# Patient Record
Sex: Male | Born: 1995 | Race: Black or African American | Hispanic: No | Marital: Single | Smoking: Current every day smoker
Health system: Southern US, Community
[De-identification: ages and names within clinical notes are randomized; demographics above are authoritative.]

## PROBLEM LIST (undated history)

## (undated) DIAGNOSIS — F909 Attention-deficit hyperactivity disorder, unspecified type: Secondary | ICD-10-CM

## (undated) DIAGNOSIS — A64 Unspecified sexually transmitted disease: Secondary | ICD-10-CM

## (undated) DIAGNOSIS — A539 Syphilis, unspecified: Secondary | ICD-10-CM

---
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
## 2016-05-19 ENCOUNTER — Encounter (HOSPITAL_COMMUNITY): Payer: Self-pay | Admitting: Emergency Medicine

## 2016-05-19 ENCOUNTER — Emergency Department (HOSPITAL_COMMUNITY)
Admission: EM | Admit: 2016-05-19 | Discharge: 2016-05-19 | Disposition: A | Discharge status: Home / Self Care | Payer: Medicaid Other | Attending: Emergency Medicine | Admitting: Emergency Medicine

## 2016-05-19 ENCOUNTER — Emergency Department (HOSPITAL_COMMUNITY): Payer: Medicaid Other

## 2016-05-19 DIAGNOSIS — Y999 Unspecified external cause status: Secondary | ICD-10-CM | POA: Insufficient documentation

## 2016-05-19 DIAGNOSIS — S59911A Unspecified injury of right forearm, initial encounter: Secondary | ICD-10-CM | POA: Diagnosis present

## 2016-05-19 DIAGNOSIS — S50811A Abrasion of right forearm, initial encounter: Secondary | ICD-10-CM | POA: Diagnosis not present

## 2016-05-19 DIAGNOSIS — Y92410 Unspecified street and highway as the place of occurrence of the external cause: Secondary | ICD-10-CM | POA: Diagnosis not present

## 2016-05-19 DIAGNOSIS — T07XXXA Unspecified multiple injuries, initial encounter: Secondary | ICD-10-CM

## 2016-05-19 DIAGNOSIS — Y939 Activity, unspecified: Secondary | ICD-10-CM | POA: Insufficient documentation

## 2016-05-19 MED ORDER — SILVER SULFADIAZINE 1 % EX CREA
TOPICAL_CREAM | Freq: Two times a day (BID) | CUTANEOUS | Status: DC
Start: 2016-05-19 — End: 2016-05-20
  Administered 2016-05-19: 23:00:00 via TOPICAL
  Filled 2016-05-19: qty 85

## 2016-05-19 MED ORDER — OXYCODONE-ACETAMINOPHEN 5-325 MG PO TABS
1.0000 | ORAL_TABLET | Freq: Once | ORAL | Status: AC
  Administered 2016-05-19: 1 via ORAL
  Filled 2016-05-19: qty 1

## 2016-05-19 MED ORDER — IBUPROFEN 800 MG PO TABS: 800.0000 mg | ORAL_TABLET | Freq: Three times a day (TID) | ORAL | Status: DC

## 2016-05-19 NOTE — ED Notes (Signed)
Pt called to room, no answer  
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
## 2016-05-19 NOTE — Discharge Instructions (Signed)
Continue your silvadene cream twice daily for the next week or until skin on hand is mostly healed. Keep clean with soap and warm water. Follow-up with your primary care doctor. Return here for new concerns.
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
## 2018-06-23 ENCOUNTER — Emergency Department (HOSPITAL_COMMUNITY)
Admission: EM | Admit: 2018-06-23 | Discharge: 2018-06-23 | Disposition: A | Discharge status: Home / Self Care | Payer: Medicaid Other | Attending: Emergency Medicine | Admitting: Emergency Medicine

## 2018-06-23 ENCOUNTER — Encounter (HOSPITAL_COMMUNITY): Payer: Self-pay | Admitting: Emergency Medicine

## 2018-06-23 ENCOUNTER — Other Ambulatory Visit: Payer: Self-pay

## 2018-06-23 DIAGNOSIS — F909 Attention-deficit hyperactivity disorder, unspecified type: Secondary | ICD-10-CM | POA: Insufficient documentation

## 2018-06-23 DIAGNOSIS — R220 Localized swelling, mass and lump, head: Secondary | ICD-10-CM | POA: Diagnosis present

## 2018-06-23 DIAGNOSIS — Z79899 Other long term (current) drug therapy: Secondary | ICD-10-CM | POA: Diagnosis not present

## 2018-06-23 DIAGNOSIS — L700 Acne vulgaris: Secondary | ICD-10-CM | POA: Diagnosis not present

## 2018-06-23 NOTE — ED Provider Notes (Signed)
[facility] EMERGENCY DEPARTMENT Provider Note   CSN: [id number] Arrival date & time: 06/23/18  4098     History   Chief Complaint Chief Complaint  Patient presents with  . Facial Swelling    HPI Thomas Espinoza is a 22 y.o. male.  22 year old male with no past medical history presents to the ED with facial swelling x1 day.  Patient states he first noticed the swelling on the right side of his face as night.  He describes the swelling as mostly on the right side of his nose, with no radiation.  States he has not taken anything for relieve in symptoms.  He denies being bit by anything, fever, rhinorrhea, sinus pressure, chest pain or shortness of breath.     Past Medical History:  Diagnosis Date  . ADHD (attention deficit hyperactivity disorder)     There are no active problems to display for this patient.   History reviewed. No pertinent surgical history.      Home Medications    Prior to Admission medications   Medication Sig Start Date End Date Taking? Authorizing Provider  ibuprofen (ADVIL,MOTRIN) 800 MG tablet Take 1 tablet (800 mg total) by mouth 3 (three) times daily. 05/19/16   Garlon Hatchet, PA-C    Family History No family history on file.  Social History Social History   Tobacco Use  . Smoking status: Never Smoker  . Smokeless tobacco: Never Used  Substance Use Topics  . Alcohol use: Yes  . Drug use: No     Allergies   Patient has no known allergies.   Review of Systems Review of Systems   Physical Exam Updated Vital Signs BP 106/84 (BP Location: Right Arm)   Pulse (!) 51   Temp 98.3 F (36.8 C) (Oral)   Resp 15   Ht 5\' 9"  (1.753 m)   Wt 61.2 kg (135 lb)   SpO2 98%   BMI 19.94 kg/m   Physical Exam  Constitutional: He is oriented to person, place, and time. He appears well-developed and well-nourished.  HENT:  Head: Normocephalic and atraumatic.    Nose: Sinus tenderness present. No rhinorrhea, nose  lacerations, nasal deformity, septal deviation or nasal septal hematoma. No epistaxis.  No foreign bodies. Right sinus exhibits no maxillary sinus tenderness and no frontal sinus tenderness. Left sinus exhibits no maxillary sinus tenderness and no frontal sinus tenderness.    Mouth/Throat: Oropharynx is clear and moist.  Pinpoint closed comedone with erythema present.  No streaking, swelling around the orbital area, pain with eye movement  Eyes: Pupils are equal, round, and reactive to light. Right conjunctiva is not injected. Left conjunctiva is not injected. No scleral icterus. Right eye exhibits normal extraocular motion. Left eye exhibits normal extraocular motion. Right pupil is reactive. Left pupil is reactive.  Neck: Normal range of motion.  Cardiovascular: Normal heart sounds.  Pulmonary/Chest: Effort normal and breath sounds normal. He has no wheezes. He exhibits no tenderness.  Abdominal: Soft. Bowel sounds are normal. He exhibits no distension. There is no tenderness.  Musculoskeletal: He exhibits no tenderness or deformity.  Neurological: He is alert and oriented to person, place, and time.  Skin: Skin is warm and dry.  Nursing note and vitals reviewed.    ED Treatments / Results  Labs (all labs ordered are listed, but only abnormal results are displayed) Labs Reviewed - No data to display  EKG None  Radiology No results found.  Procedures Procedures (including critical care time)  Medications Ordered in ED Medications - No data to display   Initial Impression / Assessment and Plan / ED Course  I have reviewed the triage vital signs and the nursing notes.  Pertinent labs & imaging results that were available during my care of the patient were reviewed by me and considered in my medical decision making (see chart for details).     Upon examination he exhibits erythema on the right nasal fold.  There is no swelling around the orbital area, no pain with eye  movement.  Patient denies any rhinorrhea, fever or sinus pressure.  Believe at this time patient has acne vulgaris, I have explained to patient he likely has a closed comedone that has not come up to surface.  Patient states he has not tried any therapy at home and does not have any medication.  I have advised patient to place toothpaste on the closed comedone to allow for redness to improve.  I have dictated patient to return if he experience any swelling under his eye, pain with movement of his eye, swelling around his eye.  Return precautions provided.  Final Clinical Impressions(s) / ED Diagnoses   Final diagnoses:  Acne vulgaris    ED Discharge Orders    None       Claude MangesSoto, Jayel Inks, PA-C 06/23/18 1015    Donnetta Hutchingook, Brian, MD 06/25/18 [phone]

## 2018-06-23 NOTE — Discharge Instructions (Signed)
Please apply toothpaste to the area to allow drying.Please return to the ED if swelling spreads under your eyes or near eye region. If you experience any fever, pain with eye movement, or chest pain please return to the ED.

## 2018-06-23 NOTE — ED Triage Notes (Signed)
Pt. Stated, the right side of my face is swollen since yesterday.

## 2018-09-19 ENCOUNTER — Encounter (HOSPITAL_COMMUNITY): Payer: Self-pay | Admitting: Emergency Medicine

## 2018-09-19 ENCOUNTER — Emergency Department (HOSPITAL_COMMUNITY)
Admission: EM | Admit: 2018-09-19 | Discharge: 2018-09-20 | Disposition: A | Discharge status: Home / Self Care | Payer: Medicaid Other | Attending: Emergency Medicine | Admitting: Emergency Medicine

## 2018-09-19 ENCOUNTER — Other Ambulatory Visit: Payer: Self-pay

## 2018-09-19 DIAGNOSIS — Z202 Contact with and (suspected) exposure to infections with a predominantly sexual mode of transmission: Secondary | ICD-10-CM | POA: Insufficient documentation

## 2018-09-19 DIAGNOSIS — N489 Disorder of penis, unspecified: Secondary | ICD-10-CM | POA: Insufficient documentation

## 2018-09-19 DIAGNOSIS — F909 Attention-deficit hyperactivity disorder, unspecified type: Secondary | ICD-10-CM | POA: Insufficient documentation

## 2018-09-19 DIAGNOSIS — Z79899 Other long term (current) drug therapy: Secondary | ICD-10-CM | POA: Insufficient documentation

## 2018-09-19 NOTE — ED Triage Notes (Signed)
Reports seeing 2 "bumps" on his penis today and wants to be checked for std's.  Denies having any symptoms.

## 2018-09-20 NOTE — Discharge Instructions (Signed)
The bumps are likely due to irritation where the hair enters the skin.  Stop shaving to decrease the frequency of these bumps. Otherwise try not to touch or irritate the lesions, they will improve with time. You were tested for gonorrhea and chlamydia today.  If the results are positive, you will receive a phone call.  If they are negative, you will not receive a phone call.  Either way, you may look online on my chart. If results are positive, you will need to follow-up with either your primary care doctor, the health department, or urgent care for treatment. Return to the emergency room with any new, worsening, or concerning symptoms.

## 2018-09-20 NOTE — ED Provider Notes (Signed)
[facility] EMERGENCY DEPARTMENT Provider Note   CSN: [id number] Arrival date & time: 09/19/18  2304     History   Chief Complaint Chief Complaint  Patient presents with  . std check    HPI Thomas Espinoza is a 22 y.o. male presenting for evaluation of bumps on his penis.  Patient states that just prior to arrival, he noticed 2 bumps on the dorsal aspect of his penis.  They do not hurt or itch.  There is nothing draining from them.  He denies history of similar.  He denies penile discharge or pain.  He denies urinary symptoms including dysuria, hematuria, urinary frequency.  He denies fevers, chills, nausea, vomiting, abdominal pain.  He has no medical problems, takes medications daily.  He is sexually active with one male partner.  She has no symptoms.  He has not been tested for STDs in the past. Pt states he shaves around his penis occasionally.   HPI  Past Medical History:  Diagnosis Date  . ADHD (attention deficit hyperactivity disorder)     There are no active problems to display for this patient.   History reviewed. No pertinent surgical history.      Home Medications    Prior to Admission medications   Medication Sig Start Date End Date Taking? Authorizing Provider  ibuprofen (ADVIL,MOTRIN) 800 MG tablet Take 1 tablet (800 mg total) by mouth 3 (three) times daily. 05/19/16   Garlon Hatchet, PA-C    Family History No family history on file.  Social History Social History   Tobacco Use  . Smoking status: Never Smoker  . Smokeless tobacco: Never Used  Substance Use Topics  . Alcohol use: Yes  . Drug use: No     Allergies   Patient has no known allergies.   Review of Systems Review of Systems  Constitutional: Negative for fever.  Genitourinary: Negative for discharge, penile pain, penile swelling, scrotal swelling and testicular pain.       2 bumps on penis.     Physical Exam Updated Vital Signs BP (!) 153/75 (BP Location:  Right Arm)   Pulse (!) 104   Temp 97.7 F (36.5 C) (Oral)   Resp 17   Ht 5\' 9"  (1.753 m)   Wt 61.2 kg   SpO2 100%   BMI 19.94 kg/m   Physical Exam  Constitutional: He is oriented to person, place, and time. He appears well-developed and well-nourished. No distress.  HENT:  Head: Normocephalic and atraumatic.  Eyes: EOM are normal.  Neck: Normal range of motion.  Cardiovascular: Normal rate, regular rhythm and intact distal pulses.  Pulmonary/Chest: Effort normal and breath sounds normal. No respiratory distress. He has no wheezes.  Abdominal: Soft. He exhibits no distension. There is no tenderness.  Genitourinary: Testes normal. Circumcised.  Genitourinary Comments: Chaperone present.  Two bumps noted on the dorsal aspect of the patient's penis, towards the base of the shaft.  Bumps consistent with folliculitis.  No pain or drainage.  No ulcerations.  No penile discharge.  No inguinal lymphadenopathy.  No tenderness of the penis or scrotum.  Musculoskeletal: Normal range of motion.  Lymphadenopathy: No inguinal adenopathy noted on the right or left side.  Neurological: He is alert and oriented to person, place, and time.  Skin: Skin is warm. Capillary refill takes less than 2 seconds. No rash noted.  Psychiatric: He has a normal mood and affect.  Nursing note and vitals reviewed.    ED Treatments /  Results  Labs (all labs ordered are listed, but only abnormal results are displayed) Labs Reviewed  GC/CHLAMYDIA PROBE AMP ([place]) NOT AT [facility]    EKG None  Radiology No results found.  Procedures Procedures (including critical care time)  Medications Ordered in ED Medications - No data to display   Initial Impression / Assessment and Plan / ED Course  I have reviewed the triage vital signs and the nursing notes.  Pertinent labs & imaging results that were available during my care of the patient were reviewed by me and considered in my medical decision making (see  chart for details).     Patient presenting for evaluation of bumps on his penis.  Physical exam reassuring, no penile discharge.  Low suspicion for STD at this time.  However patient is requesting testing.  Bumps consistent with folliculitis.  Doubt syphilis or herpes, although offered testing for syphilis.  Patient declined.  Offered testing for HIV, patient declined.  No urinary symptoms, I do not believe UA is necessary.  Discussed with patient that results will return in several days.  Discussed that if testing is positive, he will need to follow-up for treatment.  At this time, patient appears safe for discharge.  Return precautions given.  Patient states he understands and agrees to plan.  Final Clinical Impressions(s) / ED Diagnoses   Final diagnoses:  Penile lesion  Possible exposure to STD    ED Discharge Orders    None       Alveria Apley, PA-C 09/20/18 0040    Ward, Layla Maw, DO 09/20/18 0134

## 2018-09-20 NOTE — ED Notes (Signed)
Patient seen leaving prior to going over discharge instructions.

## 2018-09-22 LAB — GC/CHLAMYDIA PROBE AMP (~~LOC~~) NOT AT ARMC
Chlamydia: NEGATIVE
NEISSERIA GONORRHEA: NEGATIVE

## 2018-09-26 NOTE — ED Notes (Signed)
09/26/2018.  STD results reviewed with pt. Both are negative.  All questions answered.

## 2018-11-07 ENCOUNTER — Emergency Department (HOSPITAL_COMMUNITY)
Admission: EM | Admit: 2018-11-07 | Discharge: 2018-11-07 | Disposition: A | Discharge status: Home / Self Care | Payer: Medicaid Other | Attending: Emergency Medicine | Admitting: Emergency Medicine

## 2018-11-07 ENCOUNTER — Encounter (HOSPITAL_COMMUNITY): Payer: Self-pay | Admitting: *Deleted

## 2018-11-07 ENCOUNTER — Other Ambulatory Visit: Payer: Self-pay

## 2018-11-07 DIAGNOSIS — N489 Disorder of penis, unspecified: Secondary | ICD-10-CM | POA: Insufficient documentation

## 2018-11-07 DIAGNOSIS — Z7251 High risk heterosexual behavior: Secondary | ICD-10-CM

## 2018-11-07 DIAGNOSIS — Z79899 Other long term (current) drug therapy: Secondary | ICD-10-CM | POA: Insufficient documentation

## 2018-11-07 NOTE — Discharge Instructions (Addendum)
Apply vaseline or neosporin until improved.  Free HIV and STD Testing These locations offer FREE confidential testing for HIV, Chlamydia, Gonorrhea, and Syphilis. Non-Traditional Testing Sites Address Telephone  [facility] [address], [place] [phone] Mondays 5pm - 7pm  [facility] [address] [place] [phone] Wednesdays 2pm-8pm  [facility] [address], [place] [phone] Thursdays 9am-12noon 1pm-4pm  [facility] [address], [place] [phone] Tuesdays Thursdays 9am-12noon 1pm-4pm  [facility] offers free, confidential testing and treatment for HIV, Chlamydia, Gonorrhea, Syphilis, Herpes, Bacterial Vaginosis, Yeast, and Trichomoniasis. Traditional Testing   [facility] [address], [place] [phone]  Monday thru Friday  Call for an appointment  [facility] [address] [place] [phone] Monday thru Friday  Call for anappointment.  If you have any questions about this information please call [phone]. 09/27/2011

## 2018-11-07 NOTE — ED Provider Notes (Signed)
[facility] Provider Note   CSN: [id number] Arrival date & time: 11/07/18  [id number]     History   Chief Complaint Chief Complaint  Patient presents with  . Penile Discharge    HPI Thomas Espinoza Thomas Espinoza is a 22 y.o. male who presents the emergency department for evaluation of a lesion to his penis.  The patient states that 3 days ago he noticed that he had some tenderness at the area of his circumcision.  He denies any itching or tingling.  He denies any blistering.  He developed a crack that opened along the circumcision site.  States that he had STD testing last month however he has had unprotected intercourse.  He has more than one partner.  He denies painless lesion, penile discharge, testicle pain, history of STDs.  He denies pain or burning with urination.  HPI  Past Medical History:  Diagnosis Date  . ADHD (attention deficit hyperactivity disorder)     There are no active problems to display for this patient.   History reviewed. No pertinent surgical history.      Home Medications    Prior to Admission medications   Medication Sig Start Date End Date Taking? Authorizing Provider  ibuprofen (ADVIL,MOTRIN) 800 MG tablet Take 1 tablet (800 mg total) by mouth 3 (three) times daily. 05/19/16   Garlon HatchetSanders, Lisa M, PA-C    Family History No family history on file.  Social History Social History   Tobacco Use  . Smoking status: Never Smoker  . Smokeless tobacco: Never Used  Substance Use Topics  . Alcohol use: Yes  . Drug use: No     Allergies   Patient has no known allergies.   Review of Systems Review of Systems Ten systems reviewed and are negative for acute change, except as noted in the HPI.   Physical Exam Updated Vital Signs BP 116/60 (BP Location: Right Arm)   Pulse (!) 57   Temp 97.8 F (36.6 C) (Oral)   Resp 18   Ht 5' 8.75" (1.746 m)   Wt 61.2 kg   SpO2 98%   BMI 20.08 kg/m   Physical Exam Physical Exam    Nursing note and vitals reviewed. Constitutional: He appears well-developed and well-nourished. No distress.  HENT:  Head: Normocephalic and atraumatic.  Eyes: Conjunctivae normal are normal. No scleral icterus.  Neck: Normal range of motion. Neck supple.  Cardiovascular: Normal rate, regular rhythm and normal heart sounds.   Pulmonary/Chest: Effort normal and breath sounds normal. No respiratory distress.  Abdominal: Soft. There is no tenderness.  Musculoskeletal: He exhibits no edema.  Neurological: He is alert.  GU: Normal male anatomy, penis is circumcised.  Along the corona there is a small linear crack with the appearance of chapped skin.  No swelling, no vesicular lesions, crusting.  There is no discharge from the penile meatus, no erythema, testicles are nontender, normal cremasteric reflex. Skin: Skin is warm and dry. He is not diaphoretic.  Psychiatric: His behavior is normal.     ED Treatments / Results  Labs (all labs ordered are listed, but only abnormal results are displayed) Labs Reviewed  RPR  HIV ANTIBODY (ROUTINE TESTING W REFLEX)  GC/CHLAMYDIA PROBE AMP ([place]    EKG None  Radiology No results found.  Procedures Procedures (including critical care time)  Medications Ordered in ED Medications - No data to display   Initial Impression / Assessment and Plan / ED Course  I  have reviewed the triage vital signs and the nursing notes.  Pertinent labs & imaging results that were available during my care of the patient were reviewed by me and considered in my medical decision making (see chart for details).     Patient offered repeat STD testing.  I do not see any evidence of herpetic infection.  Patient is advised to continue treating the region with other thin film of Vaseline or Neosporin.  We will repeat his testing today.  I counseled the patient on safe sex practices.  He appears appropriate for discharge at this time  Final  Clinical Impressions(s) / ED Diagnoses   Final diagnoses:  Lesion of penis  Unprotected sex    ED Discharge Orders    None       Arthor CaptainHarris, Kaysee Hergert, PA-C 11/07/18 1021    Azalia Bilisampos, Kevin, MD 11/11/18 1515

## 2018-11-07 NOTE — ED Notes (Signed)
Pt verbalized understanding of dc instructions, vss, ambulatory with steady gait and nad

## 2018-11-07 NOTE — ED Triage Notes (Signed)
C/o pain on penis onset Sat. States he has a bump on penis

## 2018-11-08 LAB — RPR: RPR: NONREACTIVE

## 2018-11-08 LAB — HIV ANTIBODY (ROUTINE TESTING W REFLEX): HIV SCREEN 4TH GENERATION: NONREACTIVE

## 2018-11-10 LAB — GC/CHLAMYDIA PROBE AMP (~~LOC~~) NOT AT ARMC
Chlamydia: NEGATIVE
Neisseria Gonorrhea: NEGATIVE

## 2018-11-15 ENCOUNTER — Other Ambulatory Visit: Payer: Self-pay

## 2018-11-15 ENCOUNTER — Emergency Department (HOSPITAL_COMMUNITY)
Admission: EM | Admit: 2018-11-15 | Discharge: 2018-11-15 | Disposition: A | Discharge status: Home / Self Care | Payer: Medicaid Other | Attending: Emergency Medicine | Admitting: Emergency Medicine

## 2018-11-15 DIAGNOSIS — F909 Attention-deficit hyperactivity disorder, unspecified type: Secondary | ICD-10-CM | POA: Insufficient documentation

## 2018-11-15 DIAGNOSIS — B002 Herpesviral gingivostomatitis and pharyngotonsillitis: Secondary | ICD-10-CM

## 2018-11-15 DIAGNOSIS — J069 Acute upper respiratory infection, unspecified: Secondary | ICD-10-CM

## 2018-11-15 DIAGNOSIS — B9789 Other viral agents as the cause of diseases classified elsewhere: Secondary | ICD-10-CM

## 2018-11-15 DIAGNOSIS — B009 Herpesviral infection, unspecified: Secondary | ICD-10-CM | POA: Insufficient documentation

## 2018-11-15 DIAGNOSIS — Z79899 Other long term (current) drug therapy: Secondary | ICD-10-CM | POA: Insufficient documentation

## 2018-11-15 MED ORDER — ACYCLOVIR 400 MG PO TABS: 400.0000 mg | ORAL_TABLET | Freq: Three times a day (TID) | ORAL | 0 refills | Status: AC

## 2018-11-15 MED ORDER — BENZONATATE 100 MG PO CAPS: 100.0000 mg | ORAL_CAPSULE | Freq: Three times a day (TID) | ORAL | 0 refills | Status: DC | PRN

## 2018-11-15 NOTE — Discharge Instructions (Addendum)
Please read instructions below.  °You can take tylenol or ibuprofen as needed for sore throat or fever.  °Drink plenty of water.  °Use saline nasal spray for congestion. °You can take tessalon every 8 hours as needed for cough. °Follow up with your primary care provider as needed.  °Return to the ER for inability to swallow liquids, difficulty breathing, or new or worsening symptoms. ° °

## 2018-11-15 NOTE — Progress Notes (Addendum)
ED CM noted patient to have had 4 ED visits in the past 6 months. CM met with patient in triage to discuss assistance with access to care. Patient reports not having a PCP or health insurance. CM discussed the benefits of a Medical Home with PCP patient verbalized understanding, CM provided patient with resources for [facility] ([facility]) with instruction to contact office on Monday 12/30 after 8:30 am to schedule ED follow up appointment and to establish primary care . Patient verbalizes understanding teach back done and patient was  appreciative of the resources. No further ED CM needs identified 

## 2018-11-15 NOTE — ED Triage Notes (Signed)
Pt endorses cold sx with cough, runny nose, sore throat x 2 days. VSS

## 2018-11-15 NOTE — ED Notes (Signed)
Patient verbalizes understanding of discharge instructions. Opportunity for questioning and answers were provided. Armband removed by staff, pt discharged from ED.  

## 2018-11-15 NOTE — ED Provider Notes (Signed)
[facility] Provider Note   CSN: [id number] Arrival date & time: 11/15/18  [id number]     History   Chief Complaint Chief Complaint  Patient presents with  . URI    HPI Thomas Espinoza is a 22 y.o. male presenting to the ED with complaint of Glenford PeersUri sx that began on Tuesday.  Patient states symptoms began with a dry cough and hot and cold flashes.  He says by Thursday he was feeling fatigued with mild intermittent headaches.  He states he has had sore throat as well as a runny nose.  Denies associated body aches, difficulty breathing or swallowing, ear pain.  Has taken multiple over-the-counter medications for symptoms, including Delsym, NyQuil, vitamin C, TheraFlu, throat lozenges.  Has not had influenza vaccine this year.  No sick contacts. Patient also reports lesion to his left lower lip that he thinks is a cold sore.  States that arose some days ago, however is improving and scabbed.  States this is happened in the past and has applied Abreva but is requesting different medication.  Rash initially began with a few clear fluid-filled bumps which have ruptured and now is scabbed over.  The history is provided by the patient.    Past Medical History:  Diagnosis Date  . ADHD (attention deficit hyperactivity disorder)     There are no active problems to display for this patient.   No past surgical history on file.      Home Medications    Prior to Admission medications   Medication Sig Start Date End Date Taking? Authorizing Provider  acyclovir (ZOVIRAX) 400 MG tablet Take 1 tablet (400 mg total) by mouth 3 (three) times daily for 7 days. 11/15/18 11/22/18  Robinson, [place] N, PA-C  benzonatate (TESSALON) 100 MG capsule Take 1 capsule (100 mg total) by mouth 3 (three) times daily as needed for cough. 11/15/18   Robinson, [place] N, PA-C  ibuprofen (ADVIL,MOTRIN) 800 MG tablet Take 1 tablet (800 mg total) by mouth 3 (three) times daily. 05/19/16    Garlon HatchetSanders, Lisa M, PA-C    Family History No family history on file.  Social History Social History   Tobacco Use  . Smoking status: Never Smoker  . Smokeless tobacco: Never Used  Substance Use Topics  . Alcohol use: Yes  . Drug use: No     Allergies   Patient has no known allergies.   Review of Systems Review of Systems  Constitutional: Positive for fatigue.  HENT: Positive for rhinorrhea and sore throat. Negative for congestion, ear pain, trouble swallowing and voice change.   Respiratory: Positive for cough. Negative for shortness of breath.   Skin: Positive for rash.     Physical Exam Updated Vital Signs BP 112/68 (BP Location: Right Arm)   Pulse (!) 58   Temp 98.7 F (37.1 C) (Oral)   Resp 16   Ht 5\' 9"  (1.753 m)   Wt 61.2 kg   SpO2 100%   BMI 19.94 kg/m   Physical Exam Vitals signs and nursing note reviewed.  Constitutional:      Appearance: He is well-developed. He is not ill-appearing.  HENT:     Head: Normocephalic and atraumatic.     Left Ear: Tympanic membrane and ear canal normal.     Ears:     Comments: Unable to visualize right TM 2/t cerumen Eyes:     Conjunctiva/sclera: Conjunctivae normal.  Neck:     Musculoskeletal: Normal range of motion  and neck supple.  Cardiovascular:     Rate and Rhythm: Normal rate and regular rhythm.  Pulmonary:     Effort: Pulmonary effort is normal.     Breath sounds: Normal breath sounds.  Lymphadenopathy:     Cervical: No cervical adenopathy.  Skin:    Comments: Left lower lip with scabbed papular lesion, mildly tender.   Neurological:     Mental Status: He is alert.  Psychiatric:        Mood and Affect: Mood normal.        Behavior: Behavior normal.      ED Treatments / Results  Labs (all labs ordered are listed, but only abnormal results are displayed) Labs Reviewed - No data to display  EKG None  Radiology No results found.  Procedures Procedures (including critical care  time)  Medications Ordered in ED Medications - No data to display   Initial Impression / Assessment and Plan / ED Course  I have reviewed the triage vital signs and the nursing notes.  Pertinent labs & imaging results that were available during my care of the patient were reviewed by me and considered in my medical decision making (see chart for details).     Patients symptoms are consistent with URI, likely viral etiology, consider influenza.  Patient is outside of the treatment window for Tamiflu.  Afebrile, tolerating secretions.  Lungs clear to auscultation bilaterally. Also with history and lesion to left lip consistent with herpes simplex virus (this is not primary outbreak).  Patient counseled on transmission of herpes virus.  Also discussed that antibiotics are not indicated for viral respiratory infections. Pt will be discharged with symptomatic treatment and Rx for acyclovir.  Verbalizes understanding and is agreeable with plan. Pt is hemodynamically stable & in NAD prior to dc.  Discussed results, findings, treatment and follow up. Patient advised of return precautions. Patient verbalized understanding and agreed with plan.  Final Clinical Impressions(s) / ED Diagnoses   Final diagnoses:  Viral URI with cough  Oral herpes simplex infection    ED Discharge Orders         Ordered    benzonatate (TESSALON) 100 MG capsule  3 times daily PRN     11/15/18 1126    acyclovir (ZOVIRAX) 400 MG tablet  3 times daily     11/15/18 1141           Robinson, [place] N, [place] JerseyPA-C 11/15/18 1147    Maia PlanLong, Joshua G, MD 11/15/18 2033

## 2018-11-24 ENCOUNTER — Encounter (HOSPITAL_COMMUNITY): Payer: Self-pay | Admitting: Emergency Medicine

## 2018-11-24 ENCOUNTER — Emergency Department (HOSPITAL_COMMUNITY)
Admission: EM | Admit: 2018-11-24 | Discharge: 2018-11-24 | Disposition: A | Discharge status: Home / Self Care | Payer: Medicaid Other | Attending: Emergency Medicine | Admitting: Emergency Medicine

## 2018-11-24 DIAGNOSIS — H11422 Conjunctival edema, left eye: Secondary | ICD-10-CM | POA: Insufficient documentation

## 2018-11-24 DIAGNOSIS — H5712 Ocular pain, left eye: Secondary | ICD-10-CM | POA: Insufficient documentation

## 2018-11-24 DIAGNOSIS — H5789 Other specified disorders of eye and adnexa: Secondary | ICD-10-CM

## 2018-11-24 DIAGNOSIS — Z79899 Other long term (current) drug therapy: Secondary | ICD-10-CM | POA: Insufficient documentation

## 2018-11-24 MED ORDER — CLINDAMYCIN HCL 300 MG PO CAPS: 300.0000 mg | ORAL_CAPSULE | Freq: Three times a day (TID) | ORAL | 0 refills | Status: AC

## 2018-11-24 MED ORDER — IBUPROFEN 800 MG PO TABS
800.0000 mg | ORAL_TABLET | Freq: Once | ORAL | Status: AC
  Administered 2018-11-24: 800 mg via ORAL
  Filled 2018-11-24: qty 1

## 2018-11-24 MED ORDER — FLUORESCEIN SODIUM 1 MG OP STRP
1.0000 | ORAL_STRIP | Freq: Once | OPHTHALMIC | Status: AC
  Administered 2018-11-24: 1 via OPHTHALMIC
  Filled 2018-11-24: qty 1

## 2018-11-24 MED ORDER — TETRACAINE HCL 0.5 % OP SOLN
2.0000 [drp] | Freq: Once | OPHTHALMIC | Status: AC
  Administered 2018-11-24: 2 [drp] via OPHTHALMIC
  Filled 2018-11-24: qty 4

## 2018-11-24 NOTE — ED Provider Notes (Signed)
[facility] EMERGENCY DEPARTMENT Provider Note   CSN: [id number] Arrival date & time: 11/24/18  0557     History   Chief Complaint Chief Complaint  Patient presents with  . Eye Pain    Left    HPI Thomas Espinoza is a 23 y.o. male senting for evaluation of left eye pain and swelling.  Patient states that when he woke up this morning, his left eye was swollen shut.  He reports a lot of pain when trying to open his eye.  He applied warm compress, this improved with the swelling, but he still has pain when he blinks.  Pain is mostly of the superior and lateral aspect of the left eye.  He denies symptoms on the right side.  He denies photophobia, eye redness, drainage/crusting/discharge, or pain on the anterior surface of the eye.  He denies injury to the eye.  He denies foreign body sensation.  He does not wear contacts.  He denies history of eye problems in the past.  He has not taken anything for pain including Tylenol or ibuprofen.  Patient reports symptoms have improved since he first woke up this morning, but are still present.  He denies symptoms yesterday or last night.  He recently is recovering from a  Cold, otherwise denies sick contacts or contacts with eye sxs.   HPI  Past Medical History:  Diagnosis Date  . ADHD (attention deficit hyperactivity disorder)     There are no active problems to display for this patient.   History reviewed. No pertinent surgical history.      Home Medications    Prior to Admission medications   Medication Sig Start Date End Date Taking? Authorizing Provider  benzonatate (TESSALON) 100 MG capsule Take 1 capsule (100 mg total) by mouth 3 (three) times daily as needed for cough. 11/15/18   Robinson, [place] N, PA-C  clindamycin (CLEOCIN) 300 MG capsule Take 1 capsule (300 mg total) by mouth 3 (three) times daily for 10 days. 11/24/18 12/04/18  Kyngston Pickelsimer, PA-C  ibuprofen (ADVIL,MOTRIN) 800 MG tablet Take 1 tablet (800  mg total) by mouth 3 (three) times daily. 05/19/16   Garlon Hatchet, PA-C    Family History No family history on file.  Social History Social History   Tobacco Use  . Smoking status: Never Smoker  . Smokeless tobacco: Never Used  Substance Use Topics  . Alcohol use: Yes  . Drug use: No     Allergies   Patient has no known allergies.   Review of Systems Review of Systems  Constitutional: Negative for fever.  HENT: Negative for congestion and ear pain.   Eyes: Positive for pain. Negative for photophobia, discharge, redness, itching and visual disturbance.  Respiratory: Negative for cough.      Physical Exam Updated Vital Signs BP 117/74   Pulse (!) 55   Temp 97.6 F (36.4 C) (Oral)   Resp 12   SpO2 100%   Physical Exam Vitals signs and nursing note reviewed.  Constitutional:      General: He is not in acute distress.    Appearance: He is well-developed.     Comments: Sitting comfortably in the bed in no acute distress  HENT:     Head: Normocephalic and atraumatic.      Ears:     Comments: TMs nonerythematous and nonbulging bilaterally.    Nose:     Comments: No nasal congestion or mucosal edema.    Mouth/Throat:  Comments: OP clear without tonsillar swelling or exudate. Eyes:     General:        Right eye: No discharge.        Left eye: No discharge.     Extraocular Movements: Extraocular movements intact.     Conjunctiva/sclera: Conjunctivae normal.     Pupils: Pupils are equal, round, and reactive to light.     Comments: Small knot at the lateral L eye. Mild swelling of the lateral upper lid. No drainage. EOMI and PERRLA. No conjunctival injections. Visual acuity reassuring. Fluorescein stain negative.   Neck:     Musculoskeletal: Normal range of motion.  Pulmonary:     Effort: Pulmonary effort is normal.  Abdominal:     General: There is no distension.  Musculoskeletal: Normal range of motion.  Skin:    General: Skin is warm.     Findings: No  rash.  Neurological:     Mental Status: He is alert and oriented to person, place, and time.      ED Treatments / Results  Labs (all labs ordered are listed, but only abnormal results are displayed) Labs Reviewed - No data to display  EKG None  Radiology No results found.  Procedures Procedures (including critical care time)  Medications Ordered in ED Medications  tetracaine (PONTOCAINE) 0.5 % ophthalmic solution 2 drop (2 drops Left Eye Given 11/24/18 [id number])  fluorescein ophthalmic strip 1 strip (1 strip Left Eye Given 11/24/18 0639)  ibuprofen (ADVIL,MOTRIN) tablet 800 mg (800 mg Oral Given 11/24/18 [id number])     Initial Impression / Assessment and Plan / ED Course  I have reviewed the triage vital signs and the nursing notes.  Pertinent labs & imaging results that were available during my care of the patient were reviewed by me and considered in my medical decision making (see chart for details).     Presenting for evaluation of left eye pain and swelling.  Physical exam reassuring, no visual deficit, EOMI and PERRLA.  No abrasion or ulceration.  Patient with abdominal of the lateral left thigh and swelling of the left upper lid.  Concern for possible early abscess/infection.  No pain with extraocular motions, doubt orbital cellulitis.  No pain of the eye itself, doubt glaucoma.  No trauma or injury, photophobia, redness, doubt iritis.  As such, will treat with antibiotics and NSAIDs.  Discussed continued use of warm compresses.  Patient to follow-up with ophthalmology as needed.  At this time, patient appears safe for discharge.  Return precautions given.  Patient states he understands and agrees to plan.   Final Clinical Impressions(s) / ED Diagnoses   Final diagnoses:  Left eye pain  Eye swelling, left    ED Discharge Orders         Ordered    clindamycin (CLEOCIN) 300 MG capsule  3 times daily     11/24/18 0700           Alveria ApleyCaccavale, Ilayda Toda, PA-C 11/24/18 [id number]      Dione BoozeGlick, David, MD 11/24/18 [phone]

## 2018-11-24 NOTE — ED Triage Notes (Signed)
Patient reports left eye pain " every time I blink" onset this morning , denies injury or blurred vision.

## 2018-11-24 NOTE — Discharge Instructions (Addendum)
Take antibiotics as prescribed.  Take the entire course, even if your symptoms improve. Take ibuprofen 3 times a day with meals for the next week. Take 800 mg (4 pills) at a time.  Do not take other anti-inflammatories at the same time (Advil, Motrin, naproxen, Aleve). You may supplement with Tylenol if you need further pain control. Use warm compresses 3 times a day to help with pain and swelling. Follow-up with the eye doctor listed below if your symptoms not improving after 24 to 48 hours of antibiotics. Return to the emergency room if you develop vision loss, severe worsening symptoms, or any new, or concerning symptoms.

## 2019-04-07 ENCOUNTER — Encounter (HOSPITAL_COMMUNITY): Payer: Self-pay

## 2019-04-07 ENCOUNTER — Other Ambulatory Visit: Payer: Self-pay

## 2019-04-07 ENCOUNTER — Emergency Department (HOSPITAL_COMMUNITY)
Admission: EM | Admit: 2019-04-07 | Discharge: 2019-04-07 | Disposition: A | Discharge status: Home / Self Care | Payer: BLUE CROSS/BLUE SHIELD | Attending: Emergency Medicine | Admitting: Emergency Medicine

## 2019-04-07 DIAGNOSIS — N489 Disorder of penis, unspecified: Secondary | ICD-10-CM | POA: Diagnosis present

## 2019-04-07 DIAGNOSIS — F909 Attention-deficit hyperactivity disorder, unspecified type: Secondary | ICD-10-CM | POA: Diagnosis not present

## 2019-04-07 DIAGNOSIS — B009 Herpesviral infection, unspecified: Secondary | ICD-10-CM

## 2019-04-07 DIAGNOSIS — Z202 Contact with and (suspected) exposure to infections with a predominantly sexual mode of transmission: Secondary | ICD-10-CM | POA: Diagnosis not present

## 2019-04-07 MED ORDER — VALACYCLOVIR HCL 1 G PO TABS: 1000.0000 mg | ORAL_TABLET | Freq: Two times a day (BID) | ORAL | 0 refills | Status: DC

## 2019-04-07 NOTE — ED Triage Notes (Signed)
Pt arrives POV for eval for STD. Pt reports he woke up this morning and states that he noted a "reddened area" above his penis, w/ "2 dots on his penis". Denies known contact w/ positive partner. Denies hx of same. Denies penile discharge, dysuria, etc.

## 2019-04-07 NOTE — Discharge Instructions (Signed)
I suspect that you have herpes simplex virus and I will treat you with Valtrex today.  You can follow-up with the health department for subsequent STD testing and treatment of subsequent outbreaks, as HSV is unfortunately recurrent disease without a cure.  Please read the information attached.  Please return emergency department if you develop any new or worsening symptoms.  You will be called in 3 days if you test positive for gonorrhea, chlamydia, HIV, or syphilis.  If positive, please go to the health department for treatment and abstain from sexual intercourse for a week after you and your sexual partners have been treated.

## 2019-04-07 NOTE — ED Provider Notes (Signed)
[facility] EMERGENCY DEPARTMENT Provider Note   CSN: [id number] Arrival date & time: 04/07/19  1420    History   Chief Complaint Chief Complaint  Patient presents with  . Exposure to STD    HPI Thomas Espinoza is a 23 y.o. male with history of ADHD who presents with a 1 day history of lesion to the base of his penis.  He reports the lesion itches and he can tell it is there, but denies any pain.  He has had several new sexual partners recently.  He denies any penile discharge, dysuria, scrotal pain or swelling.  He does not have any known STD exposure, however would like to be tested.  He denies any abdominal pain or fever.  He has had cold sores before, but never had any lesions on his genitals.     HPI  Past Medical History:  Diagnosis Date  . ADHD (attention deficit hyperactivity disorder)     There are no active problems to display for this patient.   History reviewed. No pertinent surgical history.      Home Medications    Prior to Admission medications   Medication Sig Start Date End Date Taking? Authorizing Provider  benzonatate (TESSALON) 100 MG capsule Take 1 capsule (100 mg total) by mouth 3 (three) times daily as needed for cough. 11/15/18   Robinson, [place] N, PA-C  ibuprofen (ADVIL,MOTRIN) 800 MG tablet Take 1 tablet (800 mg total) by mouth 3 (three) times daily. 05/19/16   Garlon Hatchet, PA-C  valACYclovir (VALTREX) 1000 MG tablet Take 1 tablet (1,000 mg total) by mouth 2 (two) times daily. 04/07/19   Emi Holes, PA-C    Family History History reviewed. No pertinent family history.  Social History Social History   Tobacco Use  . Smoking status: Never Smoker  . Smokeless tobacco: Never Used  Substance Use Topics  . Alcohol use: Yes  . Drug use: No     Allergies   Patient has no known allergies.   Review of Systems Review of Systems  Constitutional: Negative for fever.  Genitourinary: Negative for discharge,  frequency, penile pain, penile swelling, scrotal swelling and testicular pain.     Physical Exam Updated Vital Signs BP 114/88 (BP Location: Right Arm)   Pulse 60   Temp 97.7 F (36.5 C) (Oral)   Resp 16   Ht 5\' 9"  (1.753 m)   Wt 59 kg   SpO2 100%   BMI 19.20 kg/m   Physical Exam Vitals signs and nursing note reviewed. Exam conducted with a chaperone present.  Constitutional:      General: He is not in acute distress.    Appearance: He is well-developed. He is not diaphoretic.  HENT:     Head: Normocephalic and atraumatic.     Mouth/Throat:     Pharynx: No oropharyngeal exudate.  Eyes:     General: No scleral icterus.       Right eye: No discharge.        Left eye: No discharge.     Conjunctiva/sclera: Conjunctivae normal.     Pupils: Pupils are equal, round, and reactive to light.  Neck:     Musculoskeletal: Normal range of motion and neck supple.     Thyroid: No thyromegaly.  Cardiovascular:     Rate and Rhythm: Normal rate and regular rhythm.     Heart sounds: Normal heart sounds. No murmur. No friction rub. No gallop.   Pulmonary:  Effort: Pulmonary effort is normal. No respiratory distress.     Breath sounds: Normal breath sounds. No stridor. No wheezing or rales.  Abdominal:     General: Bowel sounds are normal. There is no distension.     Palpations: Abdomen is soft.     Tenderness: There is no abdominal tenderness. There is no guarding or rebound.  Genitourinary:    Penis: Circumcised. Lesions present.      Scrotum/Testes: Normal.        Right: Tenderness or swelling not present.        Left: Tenderness or swelling not present.     Comments: Excoriated area at the base of the penis with 2 vesicles proximally Lymphadenopathy:     Cervical: No cervical adenopathy.     Lower Body: No right inguinal adenopathy. No left inguinal adenopathy.  Skin:    General: Skin is warm and dry.     Coloration: Skin is not pale.     Findings: No rash.  Neurological:      Mental Status: He is alert.     Coordination: Coordination normal.      ED Treatments / Results  Labs (all labs ordered are listed, but only abnormal results are displayed) Labs Reviewed  RPR  HIV ANTIBODY (ROUTINE TESTING W REFLEX)  GC/CHLAMYDIA PROBE AMP ([place]) NOT AT [facility]    EKG None  Radiology No results found.  Procedures Procedures (including critical care time)  Medications Ordered in ED Medications - No data to display   Initial Impression / Assessment and Plan / ED Course  I have reviewed the triage vital signs and the nursing notes.  Pertinent labs & imaging results that were available during my care of the patient were reviewed by me and considered in my medical decision making (see chart for details).        Patient with suspected herpes simplex virus.  Will treat with Valtrex.  GC/chlamydia, HIV, RPR pending.  Patient counseled on herpes simplex virus and advised to follow-up with the health department for further outbreaks and future testing.  Return precautions discussed.  Patient understands and agrees with plan.  Patient vital stable throughout ED course and discharged in satisfactory condition.  Final Clinical Impressions(s) / ED Diagnoses   Final diagnoses:  STD exposure  Herpes simplex infection    ED Discharge Orders         Ordered    valACYclovir (VALTREX) 1000 MG tablet  2 times daily     04/07/19 [address]           Remee Charley M, PA-C 04/07/19 1711    Maia PlanLong, Joshua G, MD 04/08/19 [phone]

## 2019-04-08 LAB — RPR, QUANT+TP ABS (REFLEX)
Rapid Plasma Reagin, Quant: 1:1 {titer} — ABNORMAL HIGH
T Pallidum Abs: NONREACTIVE

## 2019-04-08 LAB — RPR: RPR Ser Ql: REACTIVE — AB

## 2019-04-08 LAB — HIV ANTIBODY (ROUTINE TESTING W REFLEX): HIV Screen 4th Generation wRfx: NONREACTIVE

## 2019-04-09 LAB — GC/CHLAMYDIA PROBE AMP (~~LOC~~) NOT AT ARMC
Chlamydia: POSITIVE — AB
Neisseria Gonorrhea: NEGATIVE

## 2019-04-10 ENCOUNTER — Other Ambulatory Visit: Payer: Self-pay

## 2019-04-10 ENCOUNTER — Encounter (HOSPITAL_COMMUNITY): Payer: Self-pay | Admitting: Emergency Medicine

## 2019-04-10 ENCOUNTER — Emergency Department (HOSPITAL_COMMUNITY)
Admission: EM | Admit: 2019-04-10 | Discharge: 2019-04-10 | Disposition: A | Discharge status: Home / Self Care | Payer: BLUE CROSS/BLUE SHIELD | Attending: Emergency Medicine | Admitting: Emergency Medicine

## 2019-04-10 DIAGNOSIS — A510 Primary genital syphilis: Secondary | ICD-10-CM | POA: Insufficient documentation

## 2019-04-10 DIAGNOSIS — F909 Attention-deficit hyperactivity disorder, unspecified type: Secondary | ICD-10-CM | POA: Insufficient documentation

## 2019-04-10 DIAGNOSIS — Z7251 High risk heterosexual behavior: Secondary | ICD-10-CM | POA: Diagnosis not present

## 2019-04-10 DIAGNOSIS — R21 Rash and other nonspecific skin eruption: Secondary | ICD-10-CM | POA: Diagnosis present

## 2019-04-10 DIAGNOSIS — A749 Chlamydial infection, unspecified: Secondary | ICD-10-CM | POA: Insufficient documentation

## 2019-04-10 MED ORDER — AZITHROMYCIN 250 MG PO TABS
1000.0000 mg | ORAL_TABLET | Freq: Once | ORAL | Status: AC
  Administered 2019-04-10: 1000 mg via ORAL
  Filled 2019-04-10: qty 4

## 2019-04-10 MED ORDER — PENICILLIN G BENZATHINE 1200000 UNIT/2ML IM SUSP
2.4000 10*6.[IU] | Freq: Once | INTRAMUSCULAR | Status: AC
  Administered 2019-04-10: 12:00:00 2.4 10*6.[IU] via INTRAMUSCULAR
  Filled 2019-04-10: qty 4

## 2019-04-10 NOTE — Discharge Planning (Signed)
EDCM following for disposition needs.  

## 2019-04-10 NOTE — ED Notes (Signed)
Patient verbalizes understanding of discharge instructions. Opportunity for questioning and answers were provided. Armband removed by staff, pt discharged from ED home via POV.  

## 2019-04-10 NOTE — Discharge Instructions (Signed)
You have been treated today for syphilis and chlamydia.  You need to inform all of your sexual partners of your positive results so they can be tested and treated as well.  It is very important that you use protection when engaging in sexual intercourse to prevent tracking these easily preventable STDs.  It is recommended that you go to the health department in 6 months as well as in 12 months to have recheck for syphilis to ensure your treatment worked.  You can report to the health department for any further concerns regarding herpes virus.

## 2019-04-10 NOTE — ED Provider Notes (Signed)
[facility] EMERGENCY DEPARTMENT Provider Note   CSN: [id number] Arrival date & time: 04/10/19  1129    History   Chief Complaint Chief Complaint  Patient presents with  . SEXUALLY TRANSMITTED DISEASE    HPI LEART DUKE is a 23 y.o. male presenting to the emergency department after positive STD testing that was done 3 days ago in the ED on 04/07/2019.  Patient presented with rash to his penis and was diagnosed with genital herpes, discharged with acyclovir.  He had STD panel done which revealed a reactive RPR and quantitative RPR 1-1, T palladium antibodies are nonreactive.  HIV is negative.  Positive for chlamydia, negative for gonorrhea.  Reports no changes in symptoms, the rash on his genitalia is still relatively asymptomatic.  Denies fevers, chills, malaise, or any systemic symptoms.  No full body rashes.  He is sexually active with male partners without protection.     The history is provided by the patient.    Past Medical History:  Diagnosis Date  . ADHD (attention deficit hyperactivity disorder)     There are no active problems to display for this patient.   History reviewed. No pertinent surgical history.      Home Medications    Prior to Admission medications   Medication Sig Start Date End Date Taking? Authorizing Provider  benzonatate (TESSALON) 100 MG capsule Take 1 capsule (100 mg total) by mouth 3 (three) times daily as needed for cough. 11/15/18   Alyvia Derk, [place] N, PA-C  ibuprofen (ADVIL,MOTRIN) 800 MG tablet Take 1 tablet (800 mg total) by mouth 3 (three) times daily. 05/19/16   Garlon Hatchet, PA-C  valACYclovir (VALTREX) 1000 MG tablet Take 1 tablet (1,000 mg total) by mouth 2 (two) times daily. 04/07/19   Emi Holes, PA-C    Family History No family history on file.  Social History Social History   Tobacco Use  . Smoking status: Never Smoker  . Smokeless tobacco: Never Used  Substance Use Topics  . Alcohol use:  Yes  . Drug use: No     Allergies   Patient has no known allergies.   Review of Systems Review of Systems  All other systems reviewed and are negative.    Physical Exam Updated Vital Signs BP 129/82 (BP Location: Right Arm)   Pulse 62   Temp 98.2 F (36.8 C) (Oral)   Resp 16   Ht 5\' 9"  (1.753 m)   Wt 59 kg   SpO2 100%   BMI 19.20 kg/m   Physical Exam Vitals signs and nursing note reviewed.  Constitutional:      General: He is not in acute distress.    Appearance: He is well-developed.  HENT:     Head: Normocephalic and atraumatic.  Eyes:     Conjunctiva/sclera: Conjunctivae normal.  Cardiovascular:     Rate and Rhythm: Normal rate.  Pulmonary:     Effort: Pulmonary effort is normal.  Abdominal:     Palpations: Abdomen is soft.  Genitourinary:    Comments: Exam performed with male RN chaperone present.  There is scabbed lesion to the base of the penis.  No tenderness.  No drainage.  No vesicles.  No redness. Skin:    General: Skin is warm.  Neurological:     Mental Status: He is alert.  Psychiatric:        Behavior: Behavior normal.      ED Treatments / Results  Labs (all labs ordered are listed,  but only abnormal results are displayed) Labs Reviewed - No data to display  EKG None  Radiology No results found.  Procedures Procedures (including critical care time)  Medications Ordered in ED Medications  azithromycin (ZITHROMAX) tablet 1,000 mg (1,000 mg Oral Given 04/10/19 1214)  penicillin g benzathine (BICILLIN LA) 1200000 UNIT/2ML injection 2.4 Million Units (2.4 Million Units Intramuscular Given 04/10/19 1214)     Initial Impression / Assessment and Plan / ED Course  I have reviewed the triage vital signs and the nursing notes.  Pertinent labs & imaging results that were available during my care of the patient were reviewed by me and considered in my medical decision making (see chart for details).        Patient presenting for  treatment of syphilis and Chlamydia after recent testing in the ED on 04/07/2019.  Patient appears to have primary syphilis outbreak.  No systemic symptoms or diffuse rash to suggest secondary syphilis.  Negative T palladium antibodies on serology.  Patient treated for chlamydia with azithromycin and for syphilis with penicillin IM.  Patient requesting HSV serology, however will provide information for contacting the health department for this.  Instructed patient to be retested in 6 and 12 months per up-to-date guidelines.  Provided counseling for risky sexual will behavior.  Safe for discharge.  Discussed results, findings, treatment and follow up. Patient advised of return precautions. Patient verbalized understanding and agreed with plan.   Final Clinical Impressions(s) / ED Diagnoses   Final diagnoses:  Primary syphilis  Chlamydia  High risk heterosexual behavior    ED Discharge Orders    None       Amarilis Belflower, [place] N, PA-C 04/10/19 1259    Mancel Bale, MD 04/11/19 [phone]

## 2019-04-10 NOTE — ED Triage Notes (Signed)
Pt seen on 04/07/2019 and diagnosed with STD's. Pt reports taking medication (valtrex) that was prescribed for him and his symptoms are getting better but he is requesting a shot due to a call about his RPR being positive. Pt has no additional symptoms.

## 2019-09-05 ENCOUNTER — Other Ambulatory Visit: Payer: Self-pay

## 2019-09-05 ENCOUNTER — Ambulatory Visit (HOSPITAL_COMMUNITY)
Admission: EM | Admit: 2019-09-05 | Discharge: 2019-09-05 | Disposition: A | Discharge status: Home / Self Care | Payer: BLUE CROSS/BLUE SHIELD | Attending: Family Medicine | Admitting: Family Medicine

## 2019-09-05 ENCOUNTER — Encounter (HOSPITAL_COMMUNITY): Payer: Self-pay | Admitting: *Deleted

## 2019-09-05 DIAGNOSIS — N485 Ulcer of penis: Secondary | ICD-10-CM | POA: Insufficient documentation

## 2019-09-05 HISTORY — DX: Unspecified sexually transmitted disease: A64

## 2019-09-05 HISTORY — DX: Syphilis, unspecified: A53.9

## 2019-09-05 MED ORDER — ACYCLOVIR 800 MG PO TABS: 800.0000 mg | ORAL_TABLET | Freq: Three times a day (TID) | ORAL | 1 refills | Status: AC

## 2019-09-05 MED ORDER — VALACYCLOVIR HCL 1 G PO TABS: 1000.0000 mg | ORAL_TABLET | Freq: Two times a day (BID) | ORAL | 0 refills | Status: AC

## 2019-09-05 NOTE — ED Triage Notes (Signed)
C/O 2 lesions to underside of penis x 3-4 days with some discomfort.  Denies penile discharge.

## 2019-09-05 NOTE — ED Provider Notes (Signed)
[place]    CSN: [id number] Arrival date & time: 09/05/19  1251      History   Chief Complaint Chief Complaint  Patient presents with  . Appointment    1310  . Penis Pain    HPI Thomas Espinoza is a 23 y.o. male.   HPI C/O 2 lesions to underside of penis x 3-4 days with some discomfort.  Denies penile discharge.   Patient has had a genital ulcer in the past and was evaluated with blood and swab testing which was negative.  Patient intercourse with his girlfriend about 3 weeks ago and felt tingling in the penis.  He started having an ulceration about 3 to 4 days ago at the base of the penis.  He states that he had oral as well as vaginal intercourse.  He does not use a condom. Past Medical History:  Diagnosis Date  . ADHD (attention deficit hyperactivity disorder)   . STD (male)   . Syphilis     There are no active problems to display for this patient.   History reviewed. No pertinent surgical history.     Home Medications    Prior to Admission medications   Medication Sig Start Date End Date Taking? Authorizing Provider  Acetaminophen (TYLENOL PO) Take by mouth.   Yes [provider]  ASPIRIN PO Take by mouth.   Yes [provider]  benzonatate (TESSALON) 100 MG capsule Take 1 capsule (100 mg total) by mouth 3 (three) times daily as needed for cough. 11/15/18   Robinson, [place] N, PA-C  ibuprofen (ADVIL,MOTRIN) 800 MG tablet Take 1 tablet (800 mg total) by mouth 3 (three) times daily. 05/19/16   Larene Pickett, PA-C  valACYclovir (VALTREX) 1000 MG tablet Take 1 tablet (1,000 mg total) by mouth 2 (two) times daily. 04/07/19   Frederica Kuster, PA-C    Family History Family History  Problem Relation Age of Onset  . Diabetes Mother   . Healthy Father     Social History Social History   Tobacco Use  . Smoking status: Never Smoker  . Smokeless tobacco: Never Used  Substance Use Topics  . Alcohol use: Yes    Comment: every  other week  . Drug use: Never     Allergies   Patient has no known allergies.   Review of Systems Review of Systems  Skin: Positive for rash.  All other systems reviewed and are negative.    Physical Exam Triage Vital Signs ED Triage Vitals  Enc Vitals Group     BP 09/05/19 1324 128/79     Pulse Rate 09/05/19 1324 (!) 59     Resp 09/05/19 1324 16     Temp 09/05/19 1324 98.5 F (36.9 C)     Temp Source 09/05/19 1324 Other     SpO2 09/05/19 1324 98 %     Weight --      Height --      Head Circumference --      Peak Flow --      Pain Score 09/05/19 1326 7     Pain Loc --      Pain Edu? --      Excl. in [place]? --    No data found.  Updated Vital Signs BP 128/79   Pulse (!) 59   Temp 98.5 F (36.9 C) (Other (Comment))   Resp 16   SpO2 98%    Physical Exam Vitals signs and nursing note reviewed.  Constitutional:      Appearance: Normal appearance.  Neck:     Musculoskeletal: Normal range of motion and neck supple.  Pulmonary:     Effort: Pulmonary effort is normal.  Genitourinary:    Comments: 1 cm ulceration at the base of the penis on the ventral side Musculoskeletal: Normal range of motion.  Skin:    General: Skin is warm.  Neurological:     General: No focal deficit present.     Mental Status: He is alert and oriented to person, place, and time.      UC Treatments / Results  Labs (all labs ordered are listed, but only abnormal results are displayed) Labs Reviewed - No data to display  EKG   Radiology No results found.  Procedures Procedures (including critical care time)  Medications Ordered in UC Medications - No data to display  Initial Impression / Assessment and Plan / UC Course  I have reviewed the triage vital signs and the nursing notes.  Pertinent labs & imaging results that were available during my care of the patient were reviewed by me and considered in my medical decision making (see chart for details).    Final Clinical  Impressions(s) / UC Diagnoses   Final diagnoses:  None   Discharge Instructions   None    ED Prescriptions    None     PDMP not reviewed this encounter.   Elvina Sidle, MD 09/05/19 1350

## 2019-09-07 LAB — HSV CULTURE AND TYPING

## 2019-09-08 ENCOUNTER — Encounter (HOSPITAL_COMMUNITY): Payer: Self-pay

## 2019-09-08 ENCOUNTER — Telehealth (HOSPITAL_COMMUNITY): Payer: Self-pay | Admitting: Emergency Medicine

## 2019-09-08 NOTE — Telephone Encounter (Signed)
Number on file called me back, I answered and stated who I was, a male on the phone yelled 'I already know about it!" and hung up the phone.

## 2019-11-04 ENCOUNTER — Other Ambulatory Visit: Payer: Self-pay

## 2019-11-04 ENCOUNTER — Encounter (HOSPITAL_COMMUNITY): Payer: Self-pay

## 2019-11-04 ENCOUNTER — Ambulatory Visit (HOSPITAL_COMMUNITY)
Admission: EM | Admit: 2019-11-04 | Discharge: 2019-11-04 | Disposition: A | Discharge status: Home / Self Care | Payer: Self-pay | Attending: Family Medicine | Admitting: Family Medicine

## 2019-11-04 DIAGNOSIS — L700 Acne vulgaris: Secondary | ICD-10-CM

## 2019-11-04 MED ORDER — DOXYCYCLINE HYCLATE 100 MG PO CAPS: 100.0000 mg | ORAL_CAPSULE | Freq: Two times a day (BID) | ORAL | 0 refills | Status: DC

## 2019-11-04 MED ORDER — CLINDAMYCIN PHOSPHATE 1 % EX SOLN: Freq: Two times a day (BID) | CUTANEOUS | 1 refills | Status: DC

## 2019-11-04 NOTE — ED Triage Notes (Signed)
Pt presents with on bump on his left cheek on his face X 1 week.

## 2019-11-04 NOTE — ED Provider Notes (Signed)
[facility] CARE CENTER   [id number] 11/04/19 Arrival Time: 1003  ASSESSMENT & PLAN:  1. Open comedone     No signs of abscess requiring I&D.  Begin: Meds ordered this encounter  Medications  . doxycycline (VIBRAMYCIN) 100 MG capsule    Sig: Take 1 capsule (100 mg total) by mouth 2 (two) times daily.    Dispense:  14 capsule    Refill:  0  . clindamycin (CLEOCIN T) 1 % external solution    Sig: Apply topically 2 (two) times daily.    Dispense:  30 mL    Refill:  1    Will follow up with PCP or here if worsening or failing to improve as anticipated. Reviewed expectations re: course of current medical issues. Questions answered. Outlined signs and symptoms indicating need for more acute intervention. Patient verbalized understanding. After Visit Summary given.   SUBJECTIVE:  Thomas Espinoza is a 23 y.o. male who presents with a skin complaint.   Location: L upper cheek Onset: gradual Duration: at least one week Associated pruritis? none Associated pain? moderate Progression: increasing steadily  Drainage? none reported Known trigger? No  New soaps/lotions/topicals/detergents? No  Environmental exposures? No  Contacts with similar? No  Recent travel? No  Other associated symptoms: none Therapies tried thus far: none Arthralgia or myalgia? none Recent illness? none Fever? none New medications? none No specific aggravating or alleviating factors reported. H/O similar. "Usually see a doctor and the give me a medicine to take and then it goes away."  ROS: As per HPI. All other systems negative.   OBJECTIVE: Vitals:   11/04/19 1025  BP: 112/73  Pulse: (!) 58  Resp: 16  Temp: 98.4 F (36.9 C)  TempSrc: Oral  SpO2: 100%    General appearance: alert; no distress HEENT: [place]; AT Neck: supple with FROM; no LAD Lungs: clear to auscultation bilaterally Heart: regular rate and rhythm Extremities: no edema; moves all extremities normally Skin: warm and dry;  approx 4-17mm open comedone on left upper cheek; scattered closed comedones on face; no active drainage or bleeding Psychological: alert and cooperative; normal mood and affect  No Known Allergies  Past Medical History:  Diagnosis Date  . ADHD (attention deficit hyperactivity disorder)   . STD (male)   . Syphilis    Social History   Socioeconomic History  . Marital status: Single    Spouse name: Not on file  . Number of children: Not on file  . Years of education: Not on file  . Highest education level: Not on file  Occupational History  . Not on file  Tobacco Use  . Smoking status: Never Smoker  . Smokeless tobacco: Never Used  Substance and Sexual Activity  . Alcohol use: Yes    Comment: every other week  . Drug use: Never  . Sexual activity: Yes    Birth control/protection: None  Other Topics Concern  . Not on file  Social History Narrative  . Not on file   Social Determinants of Health   Financial Resource Strain:   . Difficulty of Paying Living Expenses: Not on file  Food Insecurity:   . Worried About Programme researcher, broadcasting/film/video in the Last Year: Not on file  . Ran Out of Food in the Last Year: Not on file  Transportation Needs:   . Lack of Transportation (Medical): Not on file  . Lack of Transportation (Non-Medical): Not on file  Physical Activity:   . Days of Exercise per Week: Not  on file  . Minutes of Exercise per Session: Not on file  Stress:   . Feeling of Stress : Not on file  Social Connections:   . Frequency of Communication with Friends and Family: Not on file  . Frequency of Social Gatherings with Friends and Family: Not on file  . Attends Religious Services: Not on file  . Active Member of Clubs or Organizations: Not on file  . Attends Archivist Meetings: Not on file  . Marital Status: Not on file  Intimate Partner Violence:   . Fear of Current or Ex-Partner: Not on file  . Emotionally Abused: Not on file  . Physically Abused: Not on file   . Sexually Abused: Not on file   Family History  Problem Relation Age of Onset  . Diabetes Mother   . Healthy Father    History reviewed. No pertinent surgical history.   Vanessa Kick, MD 11/04/19 1047

## 2021-01-04 ENCOUNTER — Emergency Department (HOSPITAL_COMMUNITY)
Admission: EM | Admit: 2021-01-04 | Discharge: 2021-01-04 | Disposition: A | Discharge status: Home / Self Care | Payer: BC Managed Care – PPO | Attending: Emergency Medicine | Admitting: Emergency Medicine

## 2021-01-04 ENCOUNTER — Encounter (HOSPITAL_COMMUNITY): Payer: Self-pay | Admitting: Emergency Medicine

## 2021-01-04 DIAGNOSIS — X58XXXA Exposure to other specified factors, initial encounter: Secondary | ICD-10-CM | POA: Diagnosis not present

## 2021-01-04 DIAGNOSIS — S00211A Abrasion of right eyelid and periocular area, initial encounter: Secondary | ICD-10-CM | POA: Diagnosis not present

## 2021-01-04 DIAGNOSIS — Z23 Encounter for immunization: Secondary | ICD-10-CM | POA: Insufficient documentation

## 2021-01-04 DIAGNOSIS — S0591XA Unspecified injury of right eye and orbit, initial encounter: Secondary | ICD-10-CM | POA: Diagnosis present

## 2021-01-04 MED ORDER — TETANUS-DIPHTH-ACELL PERTUSSIS 5-2.5-18.5 LF-MCG/0.5 IM SUSY
0.5000 mL | PREFILLED_SYRINGE | Freq: Once | INTRAMUSCULAR | Status: AC
  Administered 2021-01-04: 0.5 mL via INTRAMUSCULAR
  Filled 2021-01-04: qty 0.5

## 2021-01-04 MED ORDER — CEPHALEXIN 250 MG PO CAPS: 250.0000 mg | ORAL_CAPSULE | Freq: Four times a day (QID) | ORAL | 0 refills | Status: AC

## 2021-01-04 NOTE — Discharge Instructions (Signed)
At this time there does not appear to be the presence of an emergent medical condition, however there is always the potential for conditions to change. Please read and follow the below instructions.  Please return to the Emergency Department immediately for any new or worsening symptoms. Please be sure to follow up with your Primary Care Provider within one week regarding your visit today; please call their office to schedule an appointment even if you are feeling better for a follow-up visit. Please take your antibiotic Keflex as prescribed until complete to help with your symptoms.  Please drink enough water to avoid dehydration and get plenty of rest.  Go to the nearest Emergency Department immediately if: You have fever or chills You have pain when you move your eye. Your face: Hurts. Is red. Is swollen. You have loss of vision. You have drainage from your eye You have any new/concerning or worsening symptoms   Please read the additional information packets attached to your discharge summary.  Do not take your medicine if  develop an itchy rash, swelling in your mouth or lips, or difficulty breathing; call 911 and seek immediate emergency medical attention if this occurs.  You may review your lab tests and imaging results in their entirety on your MyChart account.  Please discuss all results of fully with your primary care provider and other specialist at your follow-up visit.  Note: Portions of this text may have been transcribed using voice recognition software. Every effort was made to ensure accuracy; however, inadvertent computerized transcription errors may still be present.

## 2021-01-04 NOTE — ED Triage Notes (Signed)
Pt woke up with swelling of right eye lid and pain, no redness in eye or eye drainage.

## 2021-01-04 NOTE — ED Provider Notes (Addendum)
[facility] EMERGENCY DEPARTMENT Provider Note   CSN: [id number] Arrival date & time: 01/04/21  0800     History Chief Complaint  Patient presents with  . Eye Pain    Thomas Espinoza is a 25 y.o. male history of HSV, treated syphilis, ADHD. Patient presents today for pain and swelling of the right eyebrow onset this morning describes mild aching pain of the area constant worsened with palpation improved with rest nonradiating. Denies similar in the past. Cannot recall any injury to the area. Denies headache, vision change, pain with eye movement, eye drainage, photophobia or any additional concerns.  HPI     Past Medical History:  Diagnosis Date  . ADHD (attention deficit hyperactivity disorder)   . STD (male)   . Syphilis     There are no problems to display for this patient.   History reviewed. No pertinent surgical history.     Family History  Problem Relation Age of Onset  . Diabetes Mother   . Healthy Father     Social History   Tobacco Use  . Smoking status: Never Smoker  . Smokeless tobacco: Never Used  Vaping Use  . Vaping Use: Never used  Substance Use Topics  . Alcohol use: Yes    Comment: every other week  . Drug use: Never    Home Medications Prior to Admission medications   Medication Sig Start Date End Date Taking? Authorizing Provider  cephALEXin (KEFLEX) 250 MG capsule Take 1 capsule (250 mg total) by mouth 4 (four) times daily for 7 days. 01/04/21 01/11/21 Yes Harlene Salts A, PA-C  acyclovir (ZOVIRAX) 800 MG tablet Take 1 tablet (800 mg total) by mouth 3 (three) times daily. 09/05/19   Elvina Sidle, MD  valACYclovir (VALTREX) 1000 MG tablet Take 1 tablet (1,000 mg total) by mouth 2 (two) times daily. 09/05/19   Elvina Sidle, MD    Allergies    Patient has no known allergies.  Review of Systems   Review of Systems  Constitutional: Negative.  Negative for chills and fever.  HENT: Positive for facial  swelling. Negative for trouble swallowing.   Eyes: Negative for photophobia, pain, redness and visual disturbance.  Neurological: Negative.  Negative for headaches.    Physical Exam Updated Vital Signs BP 122/84   Pulse 68   Temp (!) 97.4 F (36.3 C) (Oral)   Resp 18   SpO2 96%   Physical Exam Constitutional:      General: He is not in acute distress.    Appearance: Normal appearance. He is well-developed. He is not ill-appearing or diaphoretic.  HENT:     Head: Normocephalic and atraumatic.     Jaw: There is normal jaw occlusion. No trismus.      Comments: Mild swelling along lateral right eyebrow extending just below but not involving the eyelid itself. There is a small abrasion to the lateral portion.    Right Ear: External ear normal.     Left Ear: External ear normal.  Eyes:     General: Vision grossly intact. Gaze aligned appropriately.     Extraocular Movements: Extraocular movements intact.     Conjunctiva/sclera: Conjunctivae normal.     Pupils: Pupils are equal, round, and reactive to light.   Neck:     Trachea: Trachea and phonation normal.  Pulmonary:     Effort: Pulmonary effort is normal. No respiratory distress.  Abdominal:     General: There is no distension.     Palpations:  Abdomen is soft.     Tenderness: There is no abdominal tenderness. There is no guarding or rebound.  Musculoskeletal:        General: Normal range of motion.     Cervical back: Normal range of motion.  Skin:    General: Skin is warm and dry.  Neurological:     Mental Status: He is alert.     GCS: GCS eye subscore is 4. GCS verbal subscore is 5. GCS motor subscore is 6.     Comments: Speech is clear and goal oriented, follows commands Major Cranial nerves without deficit, no facial droop Moves extremities without ataxia, coordination intact  Psychiatric:        Behavior: Behavior normal.       ED Results / Procedures / Treatments   Labs (all labs ordered are listed, but  only abnormal results are displayed) Labs Reviewed - No data to display  EKG None  Radiology No results found.  Procedures Procedures   Medications Ordered in ED Medications  Tdap (BOOSTRIX) injection 0.5 mL (0.5 mLs Intramuscular Given 01/04/21 9147)    ED Course  I have reviewed the triage vital signs and the nursing notes.  Pertinent labs & imaging results that were available during my care of the patient were reviewed by me and considered in my medical decision making (see chart for details).    MDM Rules/Calculators/A&P                         Additional history obtained from: 1. Nursing notes from this visit. ------------------- 25 year old male presented with some swelling of the right lateral eyelid that began this morning. No vision changes. He has a small abrasion or possible ingrown hair to the lateral aspect of the swelling it does not include the eyelid itself but the space just above the eyelid between the eyebrow. There is no significant erythema increased warmth. He has no ocular involvement, no vision change or eye drainage or conjunctivitis.  Will update patient's Tdap today and treat with course of Keflex for possible mild soft tissue infection.  Does not appear as herpes zoster ophthalmicus, keratitis, scleritis, uveitis, conjunctivitis, orbital cellulitis, trauma or other emergent ocular pathology.  I encouraged patient to see his primary care provider this week for recheck  At this time there does not appear to be any evidence of an acute emergency medical condition and the patient appears stable for discharge with appropriate outpatient follow up. Diagnosis was discussed with patient who verbalizes understanding of care plan and is agreeable to discharge. I have discussed return precautions with patient who verbalizes understanding. Patient encouraged to follow-up with their PCP. All questions answered.  Patient's case discussed with Dr. Lockie Mola who agrees with  plan to discharge with follow-up.   Note: Portions of this report may have been transcribed using voice recognition software. Every effort was made to ensure accuracy; however, inadvertent computerized transcription errors may still be present. Final Clinical Impression(s) / ED Diagnoses Final diagnoses:  Abrasion of right eyebrow, initial encounter    Rx / DC Orders ED Discharge Orders         Ordered    cephALEXin (KEFLEX) 250 MG capsule  4 times daily        01/04/21 0846           Bill Salinas, PA-C 01/04/21 0851    Bill Salinas, PA-C 01/04/21 0856    Virgina Norfolk, DO 01/04/21 1038

## 2022-04-07 ENCOUNTER — Emergency Department (HOSPITAL_COMMUNITY): Payer: Medicaid Other

## 2022-04-07 ENCOUNTER — Emergency Department (HOSPITAL_COMMUNITY)
Admission: EM | Admit: 2022-04-07 | Discharge: 2022-04-08 | Disposition: A | Discharge status: Home / Self Care | Payer: Medicaid Other | Attending: Emergency Medicine | Admitting: Emergency Medicine

## 2022-04-07 DIAGNOSIS — Z5321 Procedure and treatment not carried out due to patient leaving prior to being seen by health care provider: Secondary | ICD-10-CM | POA: Insufficient documentation

## 2022-04-07 DIAGNOSIS — N509 Disorder of male genital organs, unspecified: Secondary | ICD-10-CM | POA: Insufficient documentation

## 2022-04-07 LAB — URINALYSIS, ROUTINE W REFLEX MICROSCOPIC
Bacteria, UA: NONE SEEN
Bilirubin Urine: NEGATIVE
Glucose, UA: NEGATIVE mg/dL
Hgb urine dipstick: NEGATIVE
Ketones, ur: NEGATIVE mg/dL
Leukocytes,Ua: NEGATIVE
Nitrite: NEGATIVE
Protein, ur: 100 mg/dL — AB
Specific Gravity, Urine: 1.026 (ref 1.005–1.030)
pH: 6 (ref 5.0–8.0)

## 2022-04-07 NOTE — ED Triage Notes (Signed)
Pt c/o "lump" on R side of scrotum, first noticed Friday, "started feeling it" today. Denies pain, denies urinary symptoms

## 2022-04-08 ENCOUNTER — Ambulatory Visit: Payer: Self-pay

## 2022-04-08 ENCOUNTER — Emergency Department (HOSPITAL_COMMUNITY)
Admission: EM | Admit: 2022-04-08 | Discharge: 2022-04-08 | Disposition: A | Discharge status: Home / Self Care | Payer: Medicaid Other | Attending: Emergency Medicine | Admitting: Emergency Medicine

## 2022-04-08 ENCOUNTER — Other Ambulatory Visit: Payer: Self-pay

## 2022-04-08 ENCOUNTER — Encounter (HOSPITAL_COMMUNITY): Payer: Self-pay | Admitting: Emergency Medicine

## 2022-04-08 DIAGNOSIS — N433 Hydrocele, unspecified: Secondary | ICD-10-CM

## 2022-04-08 DIAGNOSIS — I861 Scrotal varices: Secondary | ICD-10-CM

## 2022-04-08 NOTE — Discharge Instructions (Signed)
Return for any problem.  Practice good scrotal support.  Abstain from sexual intercourse for the next 1 week.  As instructed, follow-up with [facility].

## 2022-04-08 NOTE — ED Provider Notes (Signed)
Patient left without being seen by a provider.    Thomas Espinoza, Corene Cornea, MD 04/08/22 [phone]

## 2022-04-08 NOTE — ED Triage Notes (Signed)
Pt states, "you can just look at the notes from my visit from yesterday."  Per Megan's triage note yesterday, "Pt c/o "lump" on R side of scrotum, first noticed Friday, "started feeling it" today. Denies pain, denies urinary symptoms"

## 2022-04-08 NOTE — ED Notes (Signed)
Pt left before being seen. Pt was wandering around

## 2022-04-08 NOTE — ED Provider Notes (Signed)
[facility] EMERGENCY DEPARTMENT Provider Note   CSN: [id number] Arrival date & time: 04/08/22  1013     History  No chief complaint on file.   Thomas Espinoza is a 26 y.o. male.  26 year old male with prior medical history as detailed below presents for evaluation.  Patient reports that he felt a lump on the right side of his scrotum.  He first noticed this on Friday.  Patient was seen in triage yesterday.  He did not stay for the entirety of his work-up.  UA obtained yesterday was without significant abnormality.  Ultrasound obtained yesterday demonstrated presence of hydrocele on left and bilateral varicoceles.  Patient reports to this provider that he has been "fucking a lot" for the last week.  He noticed the lump after a significantly vigorous round of intercourse.  He denies penile drainage or discomfort.  He denies urinary symptoms.  He is not concerned about the possibility of STD.  He declines STD testing.  He denies fever.  He denies significant increase in size of the "lump" on the right side of his scrotum.  The history is provided by the patient and medical records.  Illness Location:  " Lump" on right scrotum Severity:  Mild Onset quality:  Gradual Duration:  2 days Timing:  Rare Progression:  Waxing and waning Chronicity:  New     Home Medications Prior to Admission medications   Medication Sig Start Date End Date Taking? Authorizing Provider  acyclovir (ZOVIRAX) 800 MG tablet Take 1 tablet (800 mg total) by mouth 3 (three) times daily. 09/05/19   Elvina Sidle, MD  valACYclovir (VALTREX) 1000 MG tablet Take 1 tablet (1,000 mg total) by mouth 2 (two) times daily. 09/05/19   Elvina Sidle, MD      Allergies    Patient has no known allergies.    Review of Systems   Review of Systems  All other systems reviewed and are negative.  Physical Exam Updated Vital Signs BP 125/80 (BP Location: Right Arm)   Pulse (!) 53   Temp 97.9 F  (36.6 C) (Oral)   Resp 16   SpO2 99%  Physical Exam Vitals and nursing note reviewed.  Constitutional:      General: He is not in acute distress.    Appearance: Normal appearance. He is well-developed.  HENT:     Head: Normocephalic and atraumatic.  Eyes:     Conjunctiva/sclera: Conjunctivae normal.     Pupils: Pupils are equal, round, and reactive to light.  Cardiovascular:     Rate and Rhythm: Normal rate and regular rhythm.     Heart sounds: Normal heart sounds.  Pulmonary:     Effort: Pulmonary effort is normal. No respiratory distress.     Breath sounds: Normal breath sounds.  Abdominal:     General: There is no distension.     Palpations: Abdomen is soft.     Tenderness: There is no abdominal tenderness.  Genitourinary:    Comments: Minimally palpable varicocele on the right side of the scrotum.  No significant abnormality noted to the bilateral testicles.  No significant tenderness noted in the scrotum.  No overlying erythema.  No penile discharge or drainage.  No ulcerations or lesions on the penis or other genitals. Musculoskeletal:        General: No deformity. Normal range of motion.     Cervical back: Normal range of motion and neck supple.  Skin:    General: Skin is warm and dry.  Neurological:     General: No focal deficit present.     Mental Status: He is alert and oriented to person, place, and time.    ED Results / Procedures / Treatments   Labs (all labs ordered are listed, but only abnormal results are displayed) Labs Reviewed - No data to display  EKG None  Radiology US SCROTUM W/DOPPLER  Result Date: 04/08/2022 CLINICAL DATA:  Testicular pain. Concern for mass. Lump over the lateral aspect of the right scrotum. EXAM: SCROTAL ULTRASOUND DOPPLER ULTRASOUND OF THE TESTICLES TECHNIQUE: Complete ultrasound examination of the testicles, epididymis, and other scrotal structures was performed. Color and spectral Doppler ultrasound were also utilized to  evaluate blood flow to the testicles. COMPARISON:  None Available. FINDINGS: Right testicle Measurements: 4.2 x 2.3 x 3.3 cm. No mass or microlithiasis visualized. Left testicle Measurements: 4.6 x 2.1 x 3.5 cm. No mass or microlithiasis visualized. Right epididymis:  Normal in size and appearance. Left epididymis:  Normal in size and appearance. Hydrocele:  Small minimally complex left hydrocele. Varicocele:  Bilateral varicoceles. Pulsed Doppler interrogation of both testes demonstrates normal low resistance arterial and venous waveforms bilaterally. No discrete mass noted in the area of palpable concern. Varicose veins are present in the scrotum adjacent to the palpable concern. IMPRESSION: 1. Unremarkable testicles. 2. Small minimally complex left hydrocele. 3. Bilateral varicoceles. 4. No discrete lesion or the palpable concern. Electronically Signed   By: Elgie Collard M.D.   On: 04/08/2022 00:23    Procedures Procedures    Medications Ordered in ED Medications - No data to display  ED Course/ Medical Decision Making/ A&P                           Medical Decision Making   Medical Screen Complete  This patient presented to the ED with complaint of lump on scrotum.  This complaint involves an extensive number of treatment options. The initial differential diagnosis includes, but is not limited to, varicocele, hydrocele, etc.  This presentation is: Acute, Self-Limited, Previously Undiagnosed, and Uncertain Prognosis  Patient presents to complete evaluation that was initiated last night.  Patient was palpable varicocele on right.  This was confirmed with ultrasound obtained overnight.  UA obtained overnight was also without significant abnormality.  Patient is reassured by ultrasound findings.  Patient understands need for scrotal support, abstinence from intercourse for the next week, close follow-up with urology.  Strict return precautions given and understood.  Reports  close follow-up stressed.  Of note, patient is not interested in STD screening.  Patient is advised that the health department and/or urology could assist in STD screening if he desires.  Additional history obtained:  External records from outside sources obtained and reviewed including prior ED visits and prior Inpatient records.  Problem List / ED Course:  Varicocele, hydrocele  Disposition:  After consideration of the diagnostic results and the patients response to treatment, I feel that the patent would benefit from close outpatient follow-up.          Final Clinical Impression(s) / ED Diagnoses Final diagnoses:  Varicocele  Hydrocele, unspecified hydrocele type    Rx / DC Orders ED Discharge Orders     None         Wynetta Fines, MD 04/08/22 1126

## 2022-04-08 NOTE — ED Notes (Signed)
Called pt for room multiple times, no response 

## 2022-10-19 DIAGNOSIS — Z419 Encounter for procedure for purposes other than remedying health state, unspecified: Secondary | ICD-10-CM | POA: Diagnosis not present

## 2022-11-04 ENCOUNTER — Other Ambulatory Visit: Payer: Self-pay

## 2022-11-04 ENCOUNTER — Emergency Department (HOSPITAL_COMMUNITY): Payer: Commercial Managed Care - HMO

## 2022-11-04 ENCOUNTER — Emergency Department (HOSPITAL_COMMUNITY)
Admission: EM | Admit: 2022-11-04 | Discharge: 2022-11-04 | Disposition: A | Discharge status: Home / Self Care | Payer: Commercial Managed Care - HMO | Attending: Emergency Medicine | Admitting: Emergency Medicine

## 2022-11-04 DIAGNOSIS — Z1152 Encounter for screening for COVID-19: Secondary | ICD-10-CM | POA: Diagnosis not present

## 2022-11-04 DIAGNOSIS — R509 Fever, unspecified: Secondary | ICD-10-CM | POA: Diagnosis present

## 2022-11-04 DIAGNOSIS — J101 Influenza due to other identified influenza virus with other respiratory manifestations: Secondary | ICD-10-CM | POA: Diagnosis not present

## 2022-11-04 LAB — RESP PANEL BY RT-PCR (RSV, FLU A&B, COVID)  RVPGX2
Influenza A by PCR: POSITIVE — AB
Influenza B by PCR: NEGATIVE
Resp Syncytial Virus by PCR: NEGATIVE
SARS Coronavirus 2 by RT PCR: NEGATIVE

## 2022-11-04 MED ORDER — ACETAMINOPHEN 325 MG PO TABS
650.0000 mg | ORAL_TABLET | Freq: Once | ORAL | Status: AC
  Administered 2022-11-04: 650 mg via ORAL
  Filled 2022-11-04: qty 2

## 2022-11-04 MED ORDER — OSELTAMIVIR PHOSPHATE 75 MG PO CAPS: 75.0000 mg | ORAL_CAPSULE | Freq: Two times a day (BID) | ORAL | 0 refills | Status: AC

## 2022-11-04 NOTE — Discharge Instructions (Signed)
As we discussed, you are flu a positive from your swab yesterday.  Your x-ray today did not show any pneumonia  Please take Tamiflu as prescribed.  As we also discussed, you are expected to run fevers for 5 to 7 days so please take Tylenol or Motrin for fever  See your doctor for follow-up  Return to ER if you have worse cough or fever or trouble breathing

## 2022-11-04 NOTE — ED Provider Notes (Signed)
[name]-EMERGENCY DEPT Provider Note   CSN: [id number] Arrival date & time: 11/04/22  1257     History  Chief Complaint  Patient presents with   Fever   Cough   Headache   Sore Throat    Thomas Espinoza is a 26 y.o. male otherwise healthy here presenting with cough and fever.  Patient has started running-fever for the last 2 days.  Patient went to urgent care yesterday.  He was told that everything was negative but several of the PCR's did not come back.  I reviewed the record from yesterday and it turns out that he is flu a positive.  He is complaining of persistent productive yellowish-greenish cough and fever.  The history is provided by the patient.       Home Medications Prior to Admission medications   Medication Sig Start Date End Date Taking? Authorizing Provider  acyclovir (ZOVIRAX) 800 MG tablet Take 1 tablet (800 mg total) by mouth 3 (three) times daily. 09/05/19   Elvina Sidle, MD  valACYclovir (VALTREX) 1000 MG tablet Take 1 tablet (1,000 mg total) by mouth 2 (two) times daily. 09/05/19   Elvina Sidle, MD      Allergies    Patient has no known allergies.    Review of Systems   Review of Systems  Constitutional:  Positive for fever.  Respiratory:  Positive for cough.   Neurological:  Positive for headaches.  All other systems reviewed and are negative.   Physical Exam Updated Vital Signs BP 117/76 (BP Location: Left Arm)   Pulse 91   Temp (!) 101.4 F (38.6 C) (Oral)   Resp 17   Ht 5\' 9"  (1.753 m)   Wt 65.8 kg   SpO2 94%   BMI 21.41 kg/m  Physical Exam Vitals and nursing note reviewed.  Constitutional:      Appearance: He is well-developed.  HENT:     Head: Normocephalic.  Eyes:     Extraocular Movements: Extraocular movements intact.     Pupils: Pupils are equal, round, and reactive to light.  Cardiovascular:     Rate and Rhythm: Normal rate and regular rhythm.  Pulmonary:     Effort: Pulmonary effort is  normal.     Comments: Diminished bilaterally  Abdominal:     General: Bowel sounds are normal.     Palpations: Abdomen is soft.  Musculoskeletal:     Cervical back: Normal range of motion and neck supple.  Neurological:     Mental Status: He is alert and oriented to person, place, and time.  Psychiatric:        Mood and Affect: Mood normal.        Behavior: Behavior normal.     ED Results / Procedures / Treatments   Labs (all labs ordered are listed, but only abnormal results are displayed) Labs Reviewed  RESP PANEL BY RT-PCR (RSV, FLU A&B, COVID)  [id number]    EKG None  Radiology DG Chest 2 View  Result Date: 11/04/2022 CLINICAL DATA:  Cough EXAM: CHEST - 2 VIEW COMPARISON:  08/02/2012 FINDINGS: The heart size and mediastinal contours are within normal limits. Both lungs are clear. The visualized skeletal structures are unremarkable. IMPRESSION: No active cardiopulmonary disease. Electronically Signed   By: 08/04/2012 D.O.   On: 11/04/2022 15:25    Procedures Procedures    Medications Ordered in ED Medications  acetaminophen (TYLENOL) tablet 650 mg (650 mg Oral Given 11/04/22 1524)    ED Course/  Medical Decision Making/ A&P                           Medical Decision Making Thomas Espinoza is a 26 y.o. male here presenting with cough and fever.  Patient's flu a positive yesterday.  Patient's chest x-ray is clear.  I think symptoms are from flu.  Patient wants Tamiflu.    Problems Addressed: Influenza A: acute illness or injury  Amount and/or Complexity of Data Reviewed Radiology: ordered and independent interpretation performed. Decision-making details documented in ED Course.  Risk OTC drugs.    Final Clinical Impression(s) / ED Diagnoses Final diagnoses:  None    Rx / DC Orders ED Discharge Orders     None         Charlynne Pander, MD 11/04/22 1545

## 2022-11-04 NOTE — ED Provider Triage Note (Signed)
Emergency Medicine Provider Triage Evaluation Note  Thomas Espinoza , a 26 y.o. male  was evaluated in triage.  Pt complains of sore throat, cough, fever, chills x 2 days. Went to UC yesterday, had negative rapid strep, flu and covid testing. Throat culture and covid PCR pending. Was given motrin. Diagnosed with likely viral illness. Took some ibuprofen through the night but fever would come back up right after. No known medical problems. Pt is concerned about pneumonia  Review of Systems  Positive: As above Negative: N/v/d  Physical Exam  BP 117/76 (BP Location: Left Arm)   Pulse 91   Temp (!) 101.4 F (38.6 C) (Oral)   Resp 17   Ht 5\' 9"  (1.753 m)   Wt 65.8 kg   SpO2 94%   BMI 21.41 kg/m  Gen:   Awake, no distress   Resp:  Normal effort  MSK:   Moves extremities without difficulty  Other:    Medical Decision Making  Medically screening exam initiated at 1:44 PM.  Appropriate orders placed.  Thomas Espinoza was informed that the remainder of the evaluation will be completed by another provider, this initial triage assessment does not replace that evaluation, and the importance of remaining in the ED until their evaluation is complete.  Will repeat respiratory viral panel   Thomas Boeve T, PA-C 11/04/22 1346

## 2022-11-04 NOTE — ED Triage Notes (Signed)
Patient c/o fever, cough, headache, chills, and a sore throat x 2 days.  Patient states he last took Ibuprofen 3 hours ago.

## 2022-11-05 DIAGNOSIS — B009 Herpesviral infection, unspecified: Secondary | ICD-10-CM | POA: Diagnosis not present

## 2022-11-05 DIAGNOSIS — J101 Influenza due to other identified influenza virus with other respiratory manifestations: Secondary | ICD-10-CM | POA: Diagnosis not present

## 2022-11-19 DIAGNOSIS — Z419 Encounter for procedure for purposes other than remedying health state, unspecified: Secondary | ICD-10-CM | POA: Diagnosis not present

## 2022-12-06 ENCOUNTER — Telehealth: Payer: Self-pay

## 2022-12-06 NOTE — Telephone Encounter (Signed)
Mychart msg sent. AS, CMA 

## 2023-06-24 ENCOUNTER — Other Ambulatory Visit: Payer: Self-pay

## 2023-06-24 ENCOUNTER — Encounter (HOSPITAL_COMMUNITY): Payer: Self-pay

## 2023-06-24 ENCOUNTER — Emergency Department (HOSPITAL_COMMUNITY)
Admission: EM | Admit: 2023-06-24 | Discharge: 2023-06-24 | Disposition: A | Discharge status: Home / Self Care | Payer: Medicaid Other | Source: Home / Self Care | Attending: Emergency Medicine | Admitting: Emergency Medicine

## 2023-06-24 DIAGNOSIS — U071 COVID-19: Secondary | ICD-10-CM | POA: Diagnosis not present

## 2023-06-24 DIAGNOSIS — R059 Cough, unspecified: Secondary | ICD-10-CM | POA: Diagnosis present

## 2023-06-24 LAB — SARS CORONAVIRUS 2 BY RT PCR: SARS Coronavirus 2 by RT PCR: POSITIVE — AB

## 2023-06-24 NOTE — ED Triage Notes (Signed)
Pt states he came here,because he tested positive for covid. Pt c/o HA, sneezing, dry cough, chillsx2d

## 2023-06-24 NOTE — ED Provider Notes (Signed)
EMERGENCY DEPARTMENT AT [facility] Provider Note   CSN: [id number] Arrival date & time: 06/24/23  1051     History  Chief Complaint  Patient presents with   Covid Positive    Login B Corpus is a 27 y.o. male.  HPI 27 year old male with no significant past medical history presents with COVID.  He states 2 days ago he started developing symptoms which include headache, cough, sneezing, fever.  He denies any trouble breathing.  No sore throat.  Tested positive for COVID on a home test yesterday.  Is here for evaluation and states his work needs a PCR test.  Home Medications Prior to Admission medications   Medication Sig Start Date End Date Taking? Authorizing Provider  acyclovir (ZOVIRAX) 800 MG tablet Take 1 tablet (800 mg total) by mouth 3 (three) times daily. 09/05/19   Elvina Sidle, MD  oseltamivir (TAMIFLU) 75 MG capsule Take 1 capsule (75 mg total) by mouth every 12 (twelve) hours. 11/04/22   Charlynne Pander, MD  valACYclovir (VALTREX) 1000 MG tablet Take 1 tablet (1,000 mg total) by mouth 2 (two) times daily. 09/05/19   Elvina Sidle, MD      Allergies    Patient has no known allergies.    Review of Systems   Review of Systems  Constitutional:  Positive for fatigue and fever.  HENT:  Negative for sore throat.   Respiratory:  Positive for cough. Negative for shortness of breath.   Neurological:  Positive for headaches.    Physical Exam Updated Vital Signs BP 129/62   Pulse 63   Temp 98.3 F (36.8 C) (Oral)   Resp 16   Ht 5\' 9"  (1.753 m)   Wt 65.8 kg   SpO2 100%   BMI 21.42 kg/m  Physical Exam Vitals and nursing note reviewed.  Constitutional:      General: He is not in acute distress.    Appearance: He is well-developed. He is not ill-appearing or diaphoretic.  HENT:     Head: Normocephalic and atraumatic.  Cardiovascular:     Rate and Rhythm: Normal rate and regular rhythm.     Heart sounds: Normal heart sounds.   Pulmonary:     Effort: Pulmonary effort is normal.     Breath sounds: Normal breath sounds. No wheezing, rhonchi or rales.  Abdominal:     General: There is no distension.  Skin:    General: Skin is warm and dry.  Neurological:     Mental Status: He is alert.     ED Results / Procedures / Treatments   Labs (all labs ordered are listed, but only abnormal results are displayed) Labs Reviewed  SARS CORONAVIRUS 2 BY RT PCR    EKG None  Radiology No results found.  Procedures Procedures    Medications Ordered in ED Medications - No data to display  ED Course/ Medical Decision Making/ A&P                                 Medical Decision Making  Patient is well-appearing.  Will send a COVID test but otherwise his vitals are normal and he is low risk for serious illness from COVID.  Discussed I do not think Paxlovid will be very helpful for him and we decided to hold off on treatment with this.  Discussed supportive care such as ibuprofen, Tylenol, increase fluids.  Otherwise, will give return precautions  but he appears stable for discharge.  Highly doubt bacterial infection at this point.        Final Clinical Impression(s) / ED Diagnoses Final diagnoses:  COVID-19    Rx / DC Orders ED Discharge Orders     None         Pricilla Loveless, MD 06/24/23 1232

## 2023-06-24 NOTE — Discharge Instructions (Signed)
You can follow-up your COVID test results in MyChart.  Otherwise drink plenty of fluids and take ibuprofen and/or Tylenol for headache, fever, etc.  If you develop any new or worsening symptoms, coughing up blood, trouble breathing, vomiting, or any other new/concerning symptoms then return to the ER or call 911.

## 2023-07-09 ENCOUNTER — Encounter (HOSPITAL_COMMUNITY): Payer: Self-pay

## 2023-07-09 ENCOUNTER — Ambulatory Visit (HOSPITAL_COMMUNITY)
Admission: EM | Admit: 2023-07-09 | Discharge: 2023-07-09 | Disposition: A | Discharge status: Home / Self Care | Payer: Medicaid Other | Attending: Family Medicine | Admitting: Family Medicine

## 2023-07-09 DIAGNOSIS — Z113 Encounter for screening for infections with a predominantly sexual mode of transmission: Secondary | ICD-10-CM | POA: Insufficient documentation

## 2023-07-09 LAB — HIV ANTIBODY (ROUTINE TESTING W REFLEX): HIV Screen 4th Generation wRfx: NONREACTIVE

## 2023-07-09 NOTE — ED Triage Notes (Addendum)
Patient states he want STI testing, hurts when urinate x day 3.

## 2023-07-10 ENCOUNTER — Ambulatory Visit (HOSPITAL_COMMUNITY)
Admission: EM | Admit: 2023-07-10 | Discharge: 2023-07-10 | Disposition: A | Discharge status: Home / Self Care | Payer: Medicaid Other | Attending: Emergency Medicine | Admitting: Emergency Medicine

## 2023-07-10 ENCOUNTER — Encounter (HOSPITAL_COMMUNITY): Payer: Self-pay

## 2023-07-10 DIAGNOSIS — Z113 Encounter for screening for infections with a predominantly sexual mode of transmission: Secondary | ICD-10-CM | POA: Insufficient documentation

## 2023-07-10 DIAGNOSIS — R369 Urethral discharge, unspecified: Secondary | ICD-10-CM | POA: Diagnosis not present

## 2023-07-10 LAB — CYTOLOGY, (ORAL, ANAL, URETHRAL) ANCILLARY ONLY
Chlamydia: NEGATIVE
Comment: NEGATIVE
Comment: NEGATIVE
Comment: NORMAL
Neisseria Gonorrhea: NEGATIVE
Trichomonas: NEGATIVE

## 2023-07-10 LAB — RPR: RPR Ser Ql: NONREACTIVE

## 2023-07-10 NOTE — ED Provider Notes (Signed)
  [facility]   [id number] 07/09/23 Arrival Time: 1940  ASSESSMENT & PLAN:  1. Screening for STDs (sexually transmitted diseases)    Pending: Labs Reviewed  HIV ANTIBODY (ROUTINE TESTING W REFLEX)  RPR  CYTOLOGY, (ORAL, ANAL, URETHRAL) ANCILLARY ONLY   Will notify of any positive results. Instructed to refrain from sexual activity for at least seven days.  Reviewed expectations re: course of current medical issues. Questions answered. Outlined signs and symptoms indicating need for more acute intervention. Patient verbalized understanding. After Visit Summary given.   SUBJECTIVE:  Thomas Espinoza is a 27 y.o. male who requests STD testing. No symptoms.   OBJECTIVE:  Vitals:   07/09/23 2026 07/09/23 2028  BP: 121/71   Pulse: 62   Temp: 98.3 F (36.8 C)   TempSrc: Oral   SpO2: 97%   Weight:  65.3 kg  Height:  5\' 9"  (1.753 m)     General appearance: alert, cooperative, appears stated age and no distress GU: deferred Skin: warm and dry Psychological: alert and cooperative; normal mood and affect.   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)    Syphilis    Family History  Problem Relation Age of Onset   Diabetes Mother    Healthy Father    Social History   Socioeconomic History   Marital status: Single    Spouse name: Not on file   Number of children: Not on file   Years of education: Not on file   Highest education level: Not on file  Occupational History   Not on file  Tobacco Use   Smoking status: Every Day    Types: E-cigarettes   Smokeless tobacco: Never  Vaping Use   Vaping status: Every Day  Substance and Sexual Activity   Alcohol use: Yes    Comment: every other week   Drug use: Never   Sexual activity: Yes    Birth  control/protection: None  Other Topics Concern   Not on file  Social History Narrative   Not on file   Social Determinants of Health   Financial Resource Strain: Not on file  Food Insecurity: No Food Insecurity (04/19/2022)   Received from [facility], [facility]   Hunger Vital Sign    Worried About Running Out of Food in the Last Year: Never true    Ran Out of Food in the Last Year: Never true  Transportation Needs: Not on file  Physical Activity: Not on file  Stress: Not on file  Social Connections: Unknown (04/02/2022)   Received from [facility], [facility]   Social Network    Social Network: Not on file  Intimate Partner Violence: Unknown (02/22/2022)   Received from [facility], [facility]   HITS    Physically Hurt: Not on file    Insult or Talk Down To: Not on file    Threaten Physical Harm: Not on file    Scream or Curse: Not on file           Mardella Layman, MD 07/10/23 [phone]

## 2023-07-10 NOTE — Discharge Instructions (Signed)
The provider has swabbed you for sexually transmitted infections.  Staff will contact if your results are positive.  Abstain from intercourse until all results have been received.  Return to clinic for new or concerning symptoms.

## 2023-07-10 NOTE — ED Provider Notes (Signed)
[facility]    CSN: [id number] Arrival date & time: 07/10/23  1957      History   Chief Complaint Chief Complaint  Patient presents with   SEXUALLY TRANSMITTED DISEASE    HPI Thomas Espinoza is a 27 y.o. male.   Patient presents to clinic requesting STI testing.  He was seen at yesterday and self swab, tested negative.  Reports he has had a clear penile discharge in his boxer briefs and is unsure if it is urine versus discharge.  Requesting staff to swab as he does not believe the results.  Denies dysuria, flank pain or penile lesions.  The history is provided by the patient and medical records.    Past Medical History:  Diagnosis Date   ADHD (attention deficit hyperactivity disorder)    STD (male)    Syphilis     There are no problems to display for this patient.   History reviewed. No pertinent surgical history.     Home Medications    Prior to Admission medications   Medication Sig Start Date End Date Taking? Authorizing Provider  acyclovir (ZOVIRAX) 800 MG tablet Take 1 tablet (800 mg total) by mouth 3 (three) times daily. 09/05/19   Elvina Sidle, MD  oseltamivir (TAMIFLU) 75 MG capsule Take 1 capsule (75 mg total) by mouth every 12 (twelve) hours. 11/04/22   Charlynne Pander, MD  valACYclovir (VALTREX) 1000 MG tablet Take 1 tablet (1,000 mg total) by mouth 2 (two) times daily. 09/05/19   Elvina Sidle, MD    Family History Family History  Problem Relation Age of Onset   Diabetes Mother    Healthy Father     Social History Social History   Tobacco Use   Smoking status: Every Day    Types: E-cigarettes   Smokeless tobacco: Never  Vaping Use   Vaping status: Every Day  Substance Use Topics   Alcohol use: Yes    Comment: every other week   Drug use: Never     Allergies   Patient has no known allergies.   Review of Systems Review of Systems  Genitourinary:  Positive for penile discharge. Negative for dysuria,  frequency, genital sores, penile pain, penile swelling and scrotal swelling.     Physical Exam Triage Vital Signs ED Triage Vitals [07/10/23 2003]  Encounter Vitals Group     BP (!) 135/90     Systolic BP Percentile      Diastolic BP Percentile      Pulse Rate 76     Resp 18     Temp 98.2 F (36.8 C)     Temp Source Oral     SpO2 96 %     Weight      Height      Head Circumference      Peak Flow      Pain Score 0     Pain Loc      Pain Education      Exclude from Growth Chart    No data found.  Updated Vital Signs BP (!) 135/90 (BP Location: Left Arm)   Pulse 76   Temp 98.2 F (36.8 C) (Oral)   Resp 18   SpO2 96%   Visual Acuity Right Eye Distance:   Left Eye Distance:   Bilateral Distance:    Right Eye Near:   Left Eye Near:    Bilateral Near:     Physical Exam Vitals and nursing note reviewed. Exam conducted with  a chaperone present.  Constitutional:      Appearance: Normal appearance.  HENT:     Head: Normocephalic and atraumatic.     Right Ear: External ear normal.     Left Ear: External ear normal.     Nose: Nose normal.     Mouth/Throat:     Mouth: Mucous membranes are moist.  Eyes:     Conjunctiva/sclera: Conjunctivae normal.  Cardiovascular:     Rate and Rhythm: Normal rate.  Pulmonary:     Effort: Pulmonary effort is normal. No respiratory distress.  Genitourinary:    Penis: Normal.   Skin:    General: Skin is warm and dry.  Neurological:     General: No focal deficit present.     Mental Status: He is alert and oriented to person, place, and time.  Psychiatric:        Mood and Affect: Mood normal.        Behavior: Behavior normal.      UC Treatments / Results  Labs (all labs ordered are listed, but only abnormal results are displayed) Labs Reviewed  CYTOLOGY, (ORAL, ANAL, URETHRAL) ANCILLARY ONLY    EKG   Radiology No results found.  Procedures Procedures (including critical care time)  Medications Ordered in  UC Medications - No data to display  Initial Impression / Assessment and Plan / UC Course  I have reviewed the triage vital signs and the nursing notes.  Pertinent labs & imaging results that were available during my care of the patient were reviewed by me and considered in my medical decision making (see chart for details).  Vitals and triage reviewed, patient is hemodynamically stable.  Penis without sores or lesions.  No obvious discharge noted on examination, chaperone present.  Cytology swab obtained by provider.  Plan of care, follow-up care and return precautions given, no questions at this time.     Final Clinical Impressions(s) / UC Diagnoses   Final diagnoses:  Screening examination for sexually transmitted disease  Penile discharge     Discharge Instructions      The provider has swabbed you for sexually transmitted infections.  Staff will contact if your results are positive.  Abstain from intercourse until all results have been received.  Return to clinic for new or concerning symptoms.    ED Prescriptions   None    PDMP not reviewed this encounter.   Suesan Mohrmann, [place], [place] 07/10/23 2009

## 2023-07-10 NOTE — ED Triage Notes (Signed)
Pt here yesterday and had neg STD testing and wants it done again. Denies d/c or burning on urination

## 2023-07-11 LAB — CYTOLOGY, (ORAL, ANAL, URETHRAL) ANCILLARY ONLY
Chlamydia: NEGATIVE
Comment: NEGATIVE
Comment: NEGATIVE
Comment: NORMAL
Neisseria Gonorrhea: NEGATIVE
Trichomonas: NEGATIVE

## 2023-07-12 ENCOUNTER — Ambulatory Visit (HOSPITAL_COMMUNITY): Admission: EM | Admit: 2023-07-12 | Payer: Medicaid Other | Source: Home / Self Care

## 2023-07-13 ENCOUNTER — Other Ambulatory Visit: Payer: Self-pay

## 2023-07-13 ENCOUNTER — Emergency Department (HOSPITAL_COMMUNITY)
Admission: EM | Admit: 2023-07-13 | Discharge: 2023-07-13 | Disposition: A | Discharge status: Home / Self Care | Payer: Medicaid Other | Attending: Emergency Medicine | Admitting: Emergency Medicine

## 2023-07-13 ENCOUNTER — Encounter (HOSPITAL_COMMUNITY): Payer: Self-pay

## 2023-07-13 DIAGNOSIS — R350 Frequency of micturition: Secondary | ICD-10-CM | POA: Diagnosis present

## 2023-07-13 LAB — URINALYSIS, ROUTINE W REFLEX MICROSCOPIC
Bilirubin Urine: NEGATIVE
Glucose, UA: NEGATIVE mg/dL
Hgb urine dipstick: NEGATIVE
Ketones, ur: NEGATIVE mg/dL
Leukocytes,Ua: NEGATIVE
Nitrite: NEGATIVE
Protein, ur: NEGATIVE mg/dL
Specific Gravity, Urine: 1.016 (ref 1.005–1.030)
pH: 7 (ref 5.0–8.0)

## 2023-07-13 LAB — CBG MONITORING, ED: Glucose-Capillary: 95 mg/dL (ref 70–99)

## 2023-07-13 NOTE — ED Notes (Signed)
Pt was roomed and walked out AMA.

## 2023-07-13 NOTE — Discharge Instructions (Addendum)
Your evaluation in the emergency department today was reassuring.  We recommend follow-up with urology given your ongoing urinary complaints.  Drink plenty of water to remain well-hydrated.  Return for any new or concerning symptoms.

## 2023-07-13 NOTE — ED Triage Notes (Signed)
Pt to ED by POV from home c/o frequent urination. Pt states he was recently on vacation and endorses drinking a lot of alcohol. States he was recently tested for STD's and these tests were negative, is concerned for a UTI. Also endorses trickling. VSS, NADN.

## 2023-07-13 NOTE — ED Provider Notes (Signed)
[place] EMERGENC[facility] DEPARTMENT AT [facility] Provider Note   CSN: [id number] Arrival date & time: 07/13/23  2047     History  Chief Complaint  Patient presents with   Urinary Frequency    Thomas Espinoza is a 27 y.o. male.  27 year old male presents to the emergency department for evaluation of urinary frequency.  He states that he has been voiding 10-15 times per day.  Symptoms have been persistent for the past few days.  He was recently on vacation and was drinking a lot of alcohol during this time.  Initially he had concern for sexually transmitted infection, but was evaluated for this x 2 at urgent care with negative test results.  He has not had any dysuria, fevers, but does report some dribbling of urine.  The history is provided by the patient. No language interpreter was used.  Urinary Frequency       Home Medications Prior to Admission medications   Medication Sig Start Date End Date Taking? Authorizing Provider  acyclovir (ZOVIRAX) 800 MG tablet Take 1 tablet (800 mg total) by mouth 3 (three) times daily. 09/05/19   Elvina Sidle, MD  oseltamivir (TAMIFLU) 75 MG capsule Take 1 capsule (75 mg total) by mouth every 12 (twelve) hours. 11/04/22   Charlynne Pander, MD  valACYclovir (VALTREX) 1000 MG tablet Take 1 tablet (1,000 mg total) by mouth 2 (two) times daily. 09/05/19   Elvina Sidle, MD      Allergies    Patient has no known allergies.    Review of Systems   Review of Systems  Genitourinary:  Positive for frequency.  Ten systems reviewed and are negative for acute change, except as noted in the HPI.    Physical Exam Updated Vital Signs BP 120/70   Pulse (!) 58   Temp 98.3 F (36.8 C)   Resp 18   SpO2 99%   Physical Exam Vitals and nursing note reviewed.  Constitutional:      General: He is not in acute distress.    Appearance: He is well-developed. He is not diaphoretic.  HENT:     Head: Normocephalic and atraumatic.  Eyes:      General: No scleral icterus.    Conjunctiva/sclera: Conjunctivae normal.  Pulmonary:     Effort: Pulmonary effort is normal. No respiratory distress.  Musculoskeletal:        General: Normal range of motion.     Cervical back: Normal range of motion.  Skin:    General: Skin is warm and dry.     Coloration: Skin is not pale.     Findings: No erythema or rash.  Neurological:     Mental Status: He is alert and oriented to person, place, and time.  Psychiatric:        Behavior: Behavior normal.     ED Results / Procedures / Treatments   Labs (all labs ordered are listed, but only abnormal results are displayed) Labs Reviewed  URINALYSIS, ROUTINE W REFLEX MICROSCOPIC  CBG MONITORING, ED    EKG None  Radiology No results found.  Procedures Procedures    Medications Ordered in ED Medications - No data to display  ED Course/ Medical Decision Making/ A&P                                 Medical Decision Making Amount and/or Complexity of Data Reviewed Labs: ordered.   This patient  presents to the ED for concern of urinary frequency, this involves an extensive number of treatment options, and is a complaint that carries with it a high risk of complications and morbidity.  The differential diagnosis includes UTI vs STD vs hyperglycemia/DKA vs OAB   Co morbidities that complicate the patient evaluation  None known   Additional history obtained:  External records from outside source obtained and reviewed including negative STD testing at [facility] on 07/09/23 and 07/10/23   Lab Tests:  I Ordered, and personally interpreted labs.  The pertinent results include:  negative UA. CBG normal.   Cardiac Monitoring:  The patient was maintained on a cardiac monitor.  I personally viewed and interpreted the cardiac monitored which showed an underlying rhythm of: sinus bradycardia   Medicines ordered and prescription drug management:  I have reviewed the patients home  medicines and have made adjustments as needed   Test Considered:  Bladder scan   Problem List / ED Course:  Patient presenting for urinary frequency.  He reports concern for STDs, but has had negative STD testing on 2 occasions in the past 72 hours.  Urinalysis today is negative for urinary tract infection.  Also no sterile pyuria to suggest STD.  He has a normal CBG.  Doubt hyperglycemia/DKA.  Will refer to urology for ongoing follow-up.   Reevaluation:  After the interventions noted above, I reevaluated the patient and found that they have :stayed the same   Social Determinants of Health:  Hx of unprotected sex   Dispostion:  After consideration of the diagnostic results and the patients response to treatment, I feel that the patent would benefit from outpatient urology follow up. Referral provided. Return precautions discussed and provided. Patient discharged in stable condition with no unaddressed concerns.          Final Clinical Impression(s) / ED Diagnoses Final diagnoses:  Urinary frequency    Rx / DC Orders ED Discharge Orders          Ordered    Ambulatory referral to Urology        07/13/23 2349              Antony Madura, Cordelia Poche 07/14/23 0002    Glynn Octave, MD 07/14/23 [phone]

## 2023-07-13 NOTE — ED Notes (Signed)
Pt did not want to stay  Pt advised to stay and be seen. Pt  left  will check mychart and PCP in the am. Pt ad.

## 2023-07-13 NOTE — ED Notes (Signed)
Pt walked out prior to paperwork.

## 2023-07-22 ENCOUNTER — Ambulatory Visit
Admission: EM | Admit: 2023-07-22 | Discharge: 2023-07-22 | Disposition: A | Discharge status: Home / Self Care | Payer: Medicaid Other | Attending: Internal Medicine | Admitting: Internal Medicine

## 2023-07-22 DIAGNOSIS — Z113 Encounter for screening for infections with a predominantly sexual mode of transmission: Secondary | ICD-10-CM

## 2023-07-22 NOTE — ED Triage Notes (Signed)
Pt requesting STD testing, took one on 8/20,21, 24 and 27 and these were negative new partner was on 8/17. Denies sxs. One partner verbalized pink discharge but then started their period the next day.

## 2023-07-22 NOTE — ED Provider Notes (Addendum)
[facility]  Note:  This document was prepared using Dragon voice recognition software and may include unintentional dictation errors.  MRN: [id number] DOB: 1995-12-24 DATE: 07/22/23   Subjective:  Chief Complaint:  Chief Complaint  Patient presents with   SEXUALLY TRANSMITTED DISEASE     HPI: Thomas Espinoza is a 27 y.o. male presenting for STD testing. Patient is currently asymptomatic. He reports new sexual partner on 07/06/2023. He has had several STD tests since then, approximately 4, and they have all been negative. He is concerned about STDs because she reported a pink discharge. However, she later told him that she had started her period the next day. He states she has no known exposures. Denies fever, nausea/vomiting, dysuria, penile discharge, penile lesions. Presents NAD.  Prior to Admission medications   Medication Sig Start Date End Date Taking? Authorizing Provider  acyclovir (ZOVIRAX) 800 MG tablet Take 1 tablet (800 mg total) by mouth 3 (three) times daily. 09/05/19   Elvina Sidle, MD  oseltamivir (TAMIFLU) 75 MG capsule Take 1 capsule (75 mg total) by mouth every 12 (twelve) hours. 11/04/22   Charlynne Pander, MD  valACYclovir (VALTREX) 1000 MG tablet Take 1 tablet (1,000 mg total) by mouth 2 (two) times daily. 09/05/19   Elvina Sidle, MD     No Known Allergies  History:   Past Medical History:  Diagnosis Date   ADHD (attention deficit hyperactivity disorder)    STD (male)    Syphilis      History reviewed. No pertinent surgical history.  Family History  Problem Relation Age of Onset   Diabetes Mother    Healthy Father     Social History   Tobacco Use   Smoking status: Every Day    Types: E-cigarettes   Smokeless tobacco: Never  Vaping Use   Vaping status: Every Day  Substance Use Topics   Alcohol use: Yes    Comment: every other week   Drug use: Never    Review of Systems  Constitutional:  Negative for fever.   Gastrointestinal:  Negative for abdominal pain, nausea and vomiting.  Genitourinary:  Negative for dysuria, genital sores, penile discharge, penile pain and testicular pain.     Objective:   Vitals: BP 114/68 (BP Location: Right Arm)   Pulse (!) 55   Temp 98.1 F (36.7 C) (Oral)   Resp 18   SpO2 97%   Physical Exam Exam conducted with a chaperone present.  Constitutional:      General: He is not in acute distress.    Appearance: Normal appearance. He is well-developed and normal weight. He is not ill-appearing or toxic-appearing.  HENT:     Head: Normocephalic and atraumatic.  Cardiovascular:     Rate and Rhythm: Normal rate and regular rhythm.     Heart sounds: Normal heart sounds.  Pulmonary:     Effort: Pulmonary effort is normal.     Breath sounds: Normal breath sounds.     Comments: Clear to auscultation bilaterally  Abdominal:     General: Bowel sounds are normal.     Palpations: Abdomen is soft.     Tenderness: There is no abdominal tenderness.  Genitourinary:    Penis: No discharge or lesions.   Skin:    General: Skin is warm and dry.  Neurological:     General: No focal deficit present.     Mental Status: He is alert.  Psychiatric:        Mood and Affect: Mood  and affect normal.     Results:  Labs: No results found for this or any previous visit (from the past 24 hour(s)).  Radiology: No results found.   UC Course/Treatments:  Procedures: Procedures   Medications Ordered in UC: Medications - No data to display   Assessment and Plan :     ICD-10-CM   1. Screening for STDs (sexually transmitted diseases)  Z11.3      Screening for STDs (sexually transmitted diseases) Afebrile, nontoxic-appearing, NAD. VSS. Asymptomatic Cytology is pending.  Safe sex precautions advised.  Will treat based on results.  Strict ED precautions were given and patient verbalized understanding.  ED Discharge Orders     None        PDMP not reviewed this  encounter.     Cynda Acres, PA-C 07/22/23 1949    Takhia Spoon, Annabell Howells, PA-C 07/22/23 1949    Shaguana Love P, PA-C 07/22/23 1949

## 2023-07-22 NOTE — Discharge Instructions (Signed)
Your swabs were sent to the lab for further testing.  You will be called with results. You should avoid all sexual activity until you have been notified of all your results and have undergone any necessary treatment.  If you are positive, it is recommended that you inform all sexual partners so they can treat be treated as well before having sex again.

## 2023-07-25 LAB — CYTOLOGY, (ORAL, ANAL, URETHRAL) ANCILLARY ONLY
Chlamydia: NEGATIVE
Comment: NEGATIVE
Comment: NEGATIVE
Comment: NORMAL
Neisseria Gonorrhea: NEGATIVE
Trichomonas: NEGATIVE

## 2024-03-22 ENCOUNTER — Other Ambulatory Visit: Payer: Self-pay

## 2024-03-22 ENCOUNTER — Emergency Department (HOSPITAL_COMMUNITY)
Admission: EM | Admit: 2024-03-22 | Discharge: 2024-03-22 | Disposition: A | Discharge status: Home / Self Care | Payer: Self-pay | Attending: Emergency Medicine | Admitting: Emergency Medicine

## 2024-03-22 ENCOUNTER — Encounter (HOSPITAL_COMMUNITY): Payer: Self-pay

## 2024-03-22 DIAGNOSIS — S01111A Laceration without foreign body of right eyelid and periocular area, initial encounter: Secondary | ICD-10-CM | POA: Insufficient documentation

## 2024-03-22 DIAGNOSIS — Z23 Encounter for immunization: Secondary | ICD-10-CM | POA: Insufficient documentation

## 2024-03-22 DIAGNOSIS — Y9367 Activity, basketball: Secondary | ICD-10-CM | POA: Insufficient documentation

## 2024-03-22 DIAGNOSIS — X58XXXA Exposure to other specified factors, initial encounter: Secondary | ICD-10-CM | POA: Insufficient documentation

## 2024-03-22 MED ORDER — LIDOCAINE HCL (PF) 1 % IJ SOLN: 30.0000 mL | Freq: Once | INTRAMUSCULAR | Status: AC

## 2024-03-22 MED ORDER — TETANUS-DIPHTH-ACELL PERTUSSIS 5-2.5-18.5 LF-MCG/0.5 IM SUSY
0.5000 mL | PREFILLED_SYRINGE | Freq: Once | INTRAMUSCULAR | Status: AC
  Administered 2024-03-22: 0.5 mL via INTRAMUSCULAR
  Filled 2024-03-22: qty 0.5

## 2024-03-22 MED ORDER — LIDOCAINE HCL (PF) 1 % IJ SOLN
INTRAMUSCULAR | Status: AC
  Administered 2024-03-22: 30 mL
  Filled 2024-03-22: qty 30

## 2024-03-22 NOTE — Discharge Instructions (Signed)
 You are seen in the emergency department today for concerns of a laceration.  He had an eyebrow laceration to your right eyebrow.  This was repaired here in the emergency department with sutures that were placed.  These need to remain in place for the next 7 days.  Please have the wound evaluated by your primary care provider, an urgent care, or here at the emergency department at the 7-day mark for removal of the sutures if the wound is healing properly.  If any signs of infection develop, please return to the emergency department for evaluation.  Keep the wound dry and clean as best as possible.  Avoid soaking the wound in any activities such as swimming, hot tubs, as these will increase the risk of infection.

## 2024-03-22 NOTE — ED Provider Notes (Signed)
 [place] EMERGENCY DEPARTMENT AT [facility] Provider Note   CSN: [id number] Arrival date & time: 03/22/24  1711     History Chief Complaint  Patient presents with   Facial Laceration    Thomas Espinoza is a 28 y.o. male.  Patient presents to the emergency department today for concerns of a laceration.  He reports that he sustained a laceration after being elbowed while playing basketball.  He reports that the laceration is to the right eyebrow.  Bleeding controlled prior to arriving.  Patient denies any headache, nausea, vomiting, or loss of consciousness from the collision.  HPI     Home Medications Prior to Admission medications   Medication Sig Start Date End Date Taking? Authorizing Provider  acyclovir  (ZOVIRAX ) 800 MG tablet Take 1 tablet (800 mg total) by mouth 3 (three) times daily. 09/05/19   Dain Drown, MD  oseltamivir  (TAMIFLU ) 75 MG capsule Take 1 capsule (75 mg total) by mouth every 12 (twelve) hours. 11/04/22   Dalene Duck, MD  valACYclovir  (VALTREX ) 1000 MG tablet Take 1 tablet (1,000 mg total) by mouth 2 (two) times daily. 09/05/19   Dain Drown, MD      Allergies    Patient has no known allergies.    Review of Systems   Review of Systems  Skin:  Positive for wound.  All other systems reviewed and are negative.   Physical Exam Updated Vital Signs BP 128/82 (BP Location: Left Arm)   Pulse 80   Temp 99.4 F (37.4 C)   Resp 16   Ht 5\' 9"  (1.753 m)   Wt 65.8 kg   SpO2 100%   BMI 21.41 kg/m  Physical Exam Vitals and nursing note reviewed.  Constitutional:      General: He is not in acute distress.    Appearance: He is well-developed.  HENT:     Head: Normocephalic and atraumatic.      Comments: Crescent shaped laceration involving the right eyebrow measuring about 5cm in length with no involvement of eyelid or deeper skin layers. No grossly contaminated. Eyes:     General: No scleral icterus.       Right eye: No  discharge.        Left eye: No discharge.     Extraocular Movements: Extraocular movements intact.     Conjunctiva/sclera: Conjunctivae normal.     Pupils: Pupils are equal, round, and reactive to light.  Musculoskeletal:        General: No swelling.     Cervical back: Neck supple.  Skin:    General: Skin is warm and dry.     Capillary Refill: Capillary refill takes less than 2 seconds.  Neurological:     Mental Status: He is alert.  Psychiatric:        Mood and Affect: Mood normal.     ED Results / Procedures / Treatments   Labs (all labs ordered are listed, but only abnormal results are displayed) Labs Reviewed - No data to display  EKG None  Radiology No results found.  Procedures .Laceration Repair  Date/Time: 03/22/2024 6:06 PM  Performed by: Syris Brookens A, PA-C Authorized by: Demeka Sutter A, PA-C   Consent:    Consent obtained:  Verbal   Consent given by:  Patient   Risks discussed:  Infection, pain and poor cosmetic result   Alternatives discussed:  No treatment and referral Universal protocol:    Patient identity confirmed:  Verbally with patient and arm band  Anesthesia:    Anesthesia method:  Local infiltration   Local anesthetic:  Lidocaine 1% w/o epi Laceration details:    Location:  Face   Face location:  R eyebrow   Length (cm):  4   Depth (mm):  2 Pre-procedure details:    Preparation:  Patient was prepped and draped in usual sterile fashion Exploration:    Hemostasis achieved with:  Direct pressure   Imaging outcome: foreign body not noted   Treatment:    Area cleansed with:  Chlorhexidine and saline   Amount of cleaning:  Standard   Irrigation solution:  Sterile saline   Irrigation volume:  200cc   Irrigation method:  Syringe   Debridement:  None   Undermining:  None Skin repair:    Repair method:  Sutures   Suture size:  6-0   Suture material:  Prolene   Number of sutures:  4 Approximation:    Approximation:  Close Repair type:     Repair type:  Simple Post-procedure details:    Dressing:  Open (no dressing)   Procedure completion:  Tolerated     Medications Ordered in ED Medications  lidocaine (PF) (XYLOCAINE) 1 % injection 30 mL (has no administration in time range)  lidocaine (PF) (XYLOCAINE) 1 % injection (has no administration in time range)  Tdap (BOOSTRIX ) injection 0.5 mL (has no administration in time range)    ED Course/ Medical Decision Making/ A&P                                 Medical Decision Making Risk Prescription drug management.   This patient presents to the ED for concern of eyebrow laceration.  Differential diagnosis includes eyebrow laceration, eyelid laceration, head trauma, concussion    Medicines ordered and prescription drug management:  I ordered medication including lidocaine, Tdap for anesthesia, updating mentation Reevaluation of the patient after these medicines showed that the patient improved I have reviewed the patients home medicines and have made adjustments as needed   Problem List / ED Course:  Patient presents to the emergency department today with concerns of a laceration.  Reports that he was playing bass ball when he was struck over the right eyebrow and sustained a laceration.  Had notable bleeding at that time but bleeding well-controlled at this time.  Not on blood thinners.  Denies loss of consciousness following the impact.  Denies any severe headache or nausea. Assessment of patient reveals a 4 cm laceration that is somewhat crescent-shaped over the right eyebrow.  No involvement of the eyelid.  Area is swollen but no bruising seen.  Some tenderness overlying this area.  No obvious visual deformity of any facial bone or facial structures. Patient consented to laceration pair.  Repair performed using 6-0 Prolene sutures with 4 placed after administration of injected lidocaine. Patient tolerated well. Advised wound recheck for possible suture removal at  about 7 days.  Discussed return precautions such as development of concerns for infection.  Otherwise stable at this time without concern for severe or significant head injury, do not feel the patient would benefit from imaging of the head as there is no LOC episode.  Will discharge home with plans for outpatient follow-up.  Final Clinical Impression(s) / ED Diagnoses Final diagnoses:  Laceration of right eyebrow, initial encounter    Rx / DC Orders ED Discharge Orders     None  Fallou Hulbert A, PA-C 03/22/24 1810    Tonya Fredrickson, MD 03/23/24 [phone]

## 2024-03-22 NOTE — ED Triage Notes (Signed)
 Pt was playing basketball just PTA and collided with another player, lac to right eyebrow. Bleeding controlled. No LOC

## 2024-11-03 ENCOUNTER — Emergency Department (HOSPITAL_COMMUNITY): Admission: EM | Admit: 2024-11-03 | Discharge: 2024-11-03 | Discharge status: Against Medical Advice | Payer: Self-pay

## 2024-11-03 NOTE — ED Notes (Signed)
No answer x3 for triage.
